# Patient Record
Sex: Male | Born: 1955
Health system: Southern US, Community
[De-identification: ages and names within clinical notes are randomized; demographics above are authoritative.]

## PROBLEM LIST (undated history)

## (undated) DIAGNOSIS — K635 Polyp of colon: Secondary | ICD-10-CM

## (undated) DIAGNOSIS — I82409 Acute embolism and thrombosis of unspecified deep veins of unspecified lower extremity: Secondary | ICD-10-CM

## (undated) DIAGNOSIS — S42035A Nondisplaced fracture of lateral end of left clavicle, initial encounter for closed fracture: Secondary | ICD-10-CM

## (undated) DIAGNOSIS — H53009 Unspecified amblyopia, unspecified eye: Secondary | ICD-10-CM

## (undated) DIAGNOSIS — S52022A Displaced fracture of olecranon process without intraarticular extension of left ulna, initial encounter for closed fracture: Secondary | ICD-10-CM

## (undated) DIAGNOSIS — I10 Essential (primary) hypertension: Secondary | ICD-10-CM

## (undated) HISTORY — DX: Unspecified amblyopia, unspecified eye: H53.009

## (undated) HISTORY — DX: Essential (primary) hypertension: I10

## (undated) HISTORY — PX: OTHER SURGICAL HISTORY: SHX169

## (undated) HISTORY — DX: Acute embolism and thrombosis of unspecified deep veins of unspecified lower extremity: I82.409

---

## 1988-05-03 HISTORY — PX: WRIST FRACTURE SURGERY: SHX121

## 2001-11-13 ENCOUNTER — Encounter (INDEPENDENT_AMBULATORY_CARE_PROVIDER_SITE_OTHER): Payer: Self-pay | Admitting: *Deleted

## 2001-11-13 ENCOUNTER — Ambulatory Visit (HOSPITAL_COMMUNITY): Admission: RE | Admit: 2001-11-13 | Discharge: 2001-11-13 | Payer: Self-pay | Admitting: Gastroenterology

## 2002-02-10 ENCOUNTER — Encounter: Payer: Self-pay | Admitting: Neurosurgery

## 2002-02-10 ENCOUNTER — Ambulatory Visit (HOSPITAL_COMMUNITY): Admission: RE | Admit: 2002-02-10 | Discharge: 2002-02-10 | Payer: Self-pay | Admitting: Neurosurgery

## 2002-02-14 ENCOUNTER — Encounter: Payer: Self-pay | Admitting: Neurosurgery

## 2002-02-16 ENCOUNTER — Encounter: Payer: Self-pay | Admitting: Anesthesiology

## 2002-02-16 ENCOUNTER — Observation Stay (HOSPITAL_COMMUNITY): Admission: RE | Admit: 2002-02-16 | Discharge: 2002-02-17 | Payer: Self-pay | Admitting: Neurosurgery

## 2007-09-19 ENCOUNTER — Ambulatory Visit (HOSPITAL_COMMUNITY): Admission: RE | Admit: 2007-09-19 | Discharge: 2007-09-19 | Payer: Self-pay | Admitting: Neurosurgery

## 2010-09-15 NOTE — Op Note (Signed)
NAMEARTEMIS, Kyle Reese               ACCOUNT NO.:  [id number]   MEDICAL RECORD NO.:  [id number]          PATIENT TYPE:  OIB   LOCATION:  3533                         FACILITY:  [facility]   PHYSICIAN:  Danae Orleans. Venetia Maxon, M.D.  DATE OF BIRTH:  1955-07-27   DATE OF PROCEDURE:  09/19/2007  DATE OF DISCHARGE:  09/19/2007                               OPERATIVE REPORT   PREOPERATIVE DIAGNOSES:  Herniated cervical disk C6-C7 with cervical  spondylosis at C5-C6 and C6-C7 levels with degenerative disk disease and  cervical radiculopathy.   POSTOPERATIVE DIAGNOSES:  Herniated cervical disk C6-C7 with cervical  spondylosis at C5-C6 and C6-C7 levels with degenerative disk disease and  cervical radiculopathy.   PROCEDURE:  1. Anterior cervical decompression with anterior cervical diskectomy      using microdissection at C6-C7.  2. Synthes ProDisc total disk arthroplasty at C6-C7.   SURGEON:  Danae Orleans. Venetia Maxon, M.D.   ASSISTANT:  Georgiann Cocker, RN and Cristi Loron, M.D.   ANESTHESIA:  General endotracheal anesthesia.   ESTIMATED BLOOD LOSS:  100 mL.   COMPLICATIONS:  None.   DISPOSITION:  Recovery.   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  He has a significant cervical spondylosis at C5-  C6, but does not appear to be symptomatic from this level.  I  recommended that he either undergo anterior cervical decompression and  arthroplasty at C6-C7 or anterior cervical decompression and fusion at  C5 through C7 levels.  He elected to pursue disk arthroplasty.  Because  of the severity of his pain and unrelenting nature of his symptoms, it  was elected to schedule the surgery.  We initiated multiple appeals  through [employer], his insurance company for authorization  for this procedure, but  both he and I felt that this was the medically  appropriate surgery and could not wait for exhaustive appeals process;  therefore, elected to proceed with surgical intervention in a timely  fashion.   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  Following  satisfactory and uncomplicated induction of general endotracheal  anesthesia and placement of intravenous lines,  the patient was placed  in a supine position on the operating table.  His neck was placed in  slight extension.  He was placed on doughnut head holder.  His sheet  roll was placed behind his shoulders and a rolled towel was placed  underlying his neck.  His shoulders were take down for visualization and  the C-arm was brought in, and we were able to visualize the C6-C7 level.  The patient's neck was then prepped and draped in the usual sterile  fashion and area of plain incision was infiltrated with local lidocaine  with epinephrine.  An incision was made from the midline to the anterior  border sternocleidomastoid muscle, carried sharply through platysma  layer.  Subplatysmal dissection was performed exposing the anterior  border of sternocleidomastoid muscle.  Using blunt dissection, the  carotid sheath was kept lateral, trachea, and esophagus kept medial,  exposing the anterior cervical spine.  The bent spinal needle was placed  and it was felt to be the C6-C7 level and this was confirmed on  intraoperative x-ray.  Subsequently, the longus colli muscles were taken  down from the anterior cervical spine from C6 through C7 bilaterally  using electrocautery and Key elevator and then self-retaining shadow  line retractor was placed to facilitate the exposure.  Using the Synthes  ProDisc distraction pins, these were placed in the C6 vertebra as well  as in the C7 vertebra in the midline, 16 mm pins were placed after using  all.  Using gentle distraction after incising the interspace and  removing disk material,  the endplates were distracted slightly, the  microscope was brought into the field, minimal drilling was performed,  but uncinate spurs were drilled down, particularly on the left at the C6-  C7 level.  The posterior longitudinal ligament was removed in piecemeal  fashion and the interspace was evacuated of residual disk material.  Under microdissection technique, the posterior longitudinal ligament was  removed and the C7 nerve roots were decompressed widely as they extended  out the neural foramina.  On the left side of midline, as anticipated,  multiple fragments of herniated disk material were removed, which were  directly compressing the left C7 nerve root.  This was felt to be well  decompressed at this point.  Hemostasis was assured and then the  distraction was removed.  The microscope was taken down this field and  total disk arthroplasty was performed.  This was done utilizing the  Synthes ProDisc system and a large deep 5 mm implant was placed after  trial sizing and confirming its position on AP and lateral fluoroscopy.  Keel cuts were made in the usual fashion with milling guide and the  implant was well visualized within the interspace.  The implant was then  inserted and tamped to position appropriately and the keel cuts were  waxed.  The hemostasis was assured and final radiograph demonstrated  well-positioned arthroplasty both on lateral and AP projections.  The  wound was then again irrigated and the distraction pins were removed and  their sites of entry into the bone were waxed.  The soft tissues were  inspected and found to be in good repair.  Hemostasis was again assured.  Platysma layer was closed with 3-0 Vicryl sutures.  Skin edges were  approximated with 3-0 Vicryl interrupted inverted sutures and the wound  was dressed with Dermabond.  The patient was x-rayed in the operating  room and taken to recovery in stable and satisfactory condition having  tolerated this  operation well.  Counts were correct.      Danae Orleans. Venetia Maxon, M.D.  Electronically Signed     JDS/MEDQ  D:  09/19/2007  T:  09/20/2007  Job:  [id number]

## 2010-09-18 NOTE — Op Note (Signed)
NAMEELGIE, MAZIARZ                         ACCOUNT NO.:  [id number]   MEDICAL RECORD NO.:  [id number]                   PATIENT TYPE:  INP   LOCATION:  3011                                 FACILITY:  [facility]   PHYSICIAN:  Danae Orleans. Venetia Maxon, M.D.               DATE OF BIRTH:  1956-01-27   DATE OF PROCEDURE:  02/16/2002  DATE OF DISCHARGE:  02/17/2002                                 OPERATIVE REPORT   PREOPERATIVE DIAGNOSIS:  Herniated cervical disk, C6-7 right, with cervical  radiculopathy, spondylosis, and degenerative disk disease.   POSTOPERATIVE DIAGNOSIS:  Herniated cervical disk, C6-7 right, with cervical  radiculopathy, spondylosis, and degenerative disk disease.   PROCEDURE:  Right C6-7 foraminotomy and microdiskectomy with  microdissection.   SURGEON:  Danae Orleans. Venetia Maxon, M.D.   ASSISTANT:  Coletta Memos, M.D.   ANESTHESIA:  General endotracheal anesthesia.   ESTIMATED BLOOD LOSS:  50 cc.   COMPLICATIONS:  None.   DISPOSITION:  To recovery.   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  It was  elected to take him to surgery for posterior foraminotomy and  microdiskectomy, as he has significant spondylosis and foraminal stenosis at  C5-6 as well and did not wish to undergo two-level anterior cervical  diskectomy and fusion.   DESCRIPTION OF PROCEDURE:  The patient was brought to the operating room.  Following the satisfactory and uncomplicated induction of general  endotracheal anesthesia, a central catheter was placed by anesthesia.  Then  he was placed in three-pin head fixation.  He was placed in a sitting  position with his neck maintained in neutral with a slightly flexed  alignment.  His posterior neck was then prepped and draped in the usual  sterile fashion.  Precordial monitoring was performed throughout the case.  Using the Met-RX tubular retractor system  and fluoroscopic visualization,  initially a guidewire was placed and then subsequently sequential dilators  to a 16 mm cannula.  The 16 mm tubular retractor to a depth of 6 cm was then  placed with fluoroscopic confirmation of its positioning.  The C6 and C7  laminae and facets were identified and using the Anspach drill and A2  equivalent bur, hemisemilaminotomy of C6 and C7 were then performed with  medial facetectomy.  This was then completed with the Kerrison rongeurs and  the common dural tube and C7 nerve root were then identified.  Using  microdissection technique and micro-instruments, the C7 nerve root was very  carefully mobilized and in the medial position directly beneath the nerve,  which initially was significantly tethered by the herniated disk material, a  large fragment of disk material was removed.  It was quite difficult to  mobilize the nerve root because of the large fragment.  Subsequently,  however, the nerve root appeared to be pulsatile  and relaxed.  The wound was  copiously irrigated with bacitracin and saline and hemostasis was assured  with Gelfoam soaked in thrombin.  The posterior cervical fascia was then  closed with 2-  0 Vicryl sutures and the skin edges were reapproximated with interrupted 3-0  Vicryl subcuticular stitch.  The wound was dressed with Dermabond.  The  patient was taken out of pin fixation and extubated in the operating room  and taken to recovery in stable and satisfactory condition.                                               Danae Orleans. Venetia Maxon, M.D.    JDS/MEDQ  D:  02/16/2002  T:  02/18/2002  Job:  [id number]

## 2011-01-27 LAB — CBC
HCT: 43.1
Platelets: 173
RDW: 13.2
WBC: 6.7

## 2011-04-15 ENCOUNTER — Other Ambulatory Visit (HOSPITAL_COMMUNITY): Payer: Self-pay | Admitting: Gastroenterology

## 2011-04-20 ENCOUNTER — Ambulatory Visit (HOSPITAL_COMMUNITY)
Admission: RE | Admit: 2011-04-20 | Discharge: 2011-04-20 | Disposition: A | Discharge status: Home / Self Care | Payer: BC Managed Care – PPO | Source: Ambulatory Visit | Attending: Gastroenterology | Admitting: Gastroenterology

## 2011-04-20 ENCOUNTER — Encounter (HOSPITAL_COMMUNITY): Payer: Self-pay

## 2011-04-20 DIAGNOSIS — R1310 Dysphagia, unspecified: Secondary | ICD-10-CM | POA: Insufficient documentation

## 2011-04-22 ENCOUNTER — Encounter (HOSPITAL_COMMUNITY)
Admission: RE | Disposition: A | Discharge status: Home / Self Care | Payer: Self-pay | Source: Ambulatory Visit | Attending: Gastroenterology

## 2011-04-22 ENCOUNTER — Other Ambulatory Visit: Payer: Self-pay | Admitting: Gastroenterology

## 2011-04-22 ENCOUNTER — Ambulatory Visit (HOSPITAL_COMMUNITY)
Admission: RE | Admit: 2011-04-22 | Discharge: 2011-04-22 | Disposition: A | Discharge status: Home / Self Care | Payer: BC Managed Care – PPO | Source: Ambulatory Visit | Attending: Gastroenterology | Admitting: Gastroenterology

## 2011-04-22 DIAGNOSIS — E785 Hyperlipidemia, unspecified: Secondary | ICD-10-CM | POA: Insufficient documentation

## 2011-04-22 DIAGNOSIS — R0789 Other chest pain: Secondary | ICD-10-CM | POA: Insufficient documentation

## 2011-04-22 DIAGNOSIS — R1310 Dysphagia, unspecified: Secondary | ICD-10-CM | POA: Insufficient documentation

## 2011-04-22 DIAGNOSIS — Z7982 Long term (current) use of aspirin: Secondary | ICD-10-CM | POA: Insufficient documentation

## 2011-04-22 HISTORY — PX: ESOPHAGOGASTRODUODENOSCOPY: SHX5428

## 2011-04-22 HISTORY — DX: Polyp of colon: K63.5

## 2011-04-22 SURGERY — EGD (ESOPHAGOGASTRODUODENOSCOPY)
Anesthesia: Moderate Sedation

## 2011-04-22 MED ORDER — FENTANYL CITRATE 0.05 MG/ML IJ SOLN
INTRAMUSCULAR | Status: AC
  Filled 2011-04-22: qty 2

## 2011-04-22 MED ORDER — FENTANYL NICU IV SYRINGE 50 MCG/ML
INJECTION | INTRAMUSCULAR | Status: DC | PRN
  Administered 2011-04-22: 50 ug via INTRAVENOUS
  Administered 2011-04-22 (×3): 25 ug via INTRAVENOUS

## 2011-04-22 MED ORDER — MIDAZOLAM HCL 10 MG/2ML IJ SOLN
INTRAMUSCULAR | Status: DC | PRN
  Administered 2011-04-22 (×4): 2.5 mg via INTRAVENOUS

## 2011-04-22 MED ORDER — MIDAZOLAM HCL 10 MG/2ML IJ SOLN
INTRAMUSCULAR | Status: AC
  Filled 2011-04-22: qty 2

## 2011-04-22 MED ORDER — SODIUM CHLORIDE 0.9 % IV SOLN
Freq: Once | INTRAVENOUS | Status: AC
  Administered 2011-04-22: 20 mL/h via INTRAVENOUS

## 2011-04-22 MED ORDER — BUTAMBEN-TETRACAINE-BENZOCAINE 2-2-14 % EX AERO
INHALATION_SPRAY | CUTANEOUS | Status: DC | PRN
  Administered 2011-04-22: 1 via TOPICAL

## 2011-04-22 NOTE — Op Note (Signed)
Procedure: Diagnostic esophagogastroduodenoscopy with esophageal biopsies to rule out Barrett's esophagus and eosinophilic esophagitis.  Endoscopist: Danise Edge  Premedication: Fentanyl 100 mcg intravenously. Versed 10 mg intravenously.  Indication: Kyle Reese is a 55 year old male born December 19, 1955. The patient has had intermittent solid food  Esophageal dysphagia and intermittent mid retrosternal chest discomfort on and off since July 2012. His father was diagnosed with Barrett's esophagus. He denies chronic heartburn or indigestion. He underwent a barium esophagram with barium tablet which showed subtle mucosal thickening in the distal esophagus suggestive of esophagitis; the barium tablet traversed the esophagus and into the stomach without obstruction.  The patient is scheduled undergo diagnostic esophagogastroduodenoscopy with esophageal biopsies to rule out Barrett's esophagus and eosinophilic esophagitis.  Procedure: The patient was placed in the left lateral decubitus position. The Pentax gastroscope was passed through the posterior hypopharynx into the proximal esophagus without difficulty. The hypopharynx, larynx, and vocal cords appeared normal.  Esophagoscopy: The proximal, mid, and lower segments of the esophageal mucosa appeared normal. The squamocolumnar junction is noted at approximately 39 cm from the incisor teeth. There is no endoscopic evidence for the presence of erosive esophagitis, esophageal stricture formation, esophageal obstruction, or Barrett's esophagus. Four-quadrant biopsies were taken at the esophagogastric junction to look for Barrett's esophagus. Random esophageal body biopsies were performed to look for eosinophilic esophagitis.  Gastroscopy: Retroflex view of the gastric cardia and fundus was normal. The gastric body, antrum, and pylorus appeared completely normal.  Duodenoscopy: The duodenal bulb and descending duodenum appeared normal  Assessment:  Normal esophagogastroduodenoscopy. Esophageal biopsies to look for Barrett's esophagus and eosinophilic esophagitis are pending.

## 2011-04-22 NOTE — H&P (Signed)
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 The patient has had intermittent solid food esophageal dysphagia and intermittent mid retrosternal chest discomfort on and off since July 2012. He tried taking omeprazole without significant improvement in his symptoms. His father was diagnosed with Barrett's esophagus. He denies chronic heartburn or indigestion.  The patient underwent a diagnostic barium esophagram with barium tablet which showed subtle mucosal fold thickening in the distal esophagus suspicious for a esophagitis. The barium tablet traversed the esophagus and into the stomach without obstruction.  Chronic medications: Coenzyme Q 10. Vitamin D. Is a. Fish oil. Magnesium. Aspirin.  Past medical and surgical history: Hyperlipidemia. Internal hemorrhoids. Adenomatous colon polyps removed colonoscopically. Cervical disc surgery. Left wrist fracture with surgical repair.  Habits: The patient has never smoked cigarettes. He consumes alcohol in moderation.  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 Sclera and conjunctiva appear normal. Mouth and throat appear normal. Lungs are clear to auscultation. Cardiac exam reveals a regular rhythm without audible murmurs. Abdomen is soft, flat, and nontender to palpation in all quadrants  Plan: Proceed with diagnostic esophagogastroduodenoscopy with random esophageal biopsies to rule out eosinophilic esophagitis and to rule out Barrett's esophagus.

## 2011-04-23 ENCOUNTER — Encounter (HOSPITAL_COMMUNITY): Payer: Self-pay | Admitting: Gastroenterology

## 2011-04-23 ENCOUNTER — Encounter (HOSPITAL_COMMUNITY): Payer: Self-pay

## 2011-11-18 ENCOUNTER — Ambulatory Visit (INDEPENDENT_AMBULATORY_CARE_PROVIDER_SITE_OTHER): Payer: BC Managed Care – PPO | Admitting: Sports Medicine

## 2011-11-18 ENCOUNTER — Ambulatory Visit
Admission: RE | Admit: 2011-11-18 | Discharge: 2011-11-18 | Disposition: A | Discharge status: Home / Self Care | Payer: BC Managed Care – PPO | Source: Ambulatory Visit | Attending: Sports Medicine | Admitting: Sports Medicine

## 2011-11-18 ENCOUNTER — Other Ambulatory Visit: Payer: Self-pay | Admitting: Sports Medicine

## 2011-11-18 VITALS — BP 147/88 | Ht 68.0 in | Wt 170.0 lb

## 2011-11-18 DIAGNOSIS — R0781 Pleurodynia: Secondary | ICD-10-CM

## 2011-11-18 DIAGNOSIS — R079 Chest pain, unspecified: Secondary | ICD-10-CM

## 2011-11-18 DIAGNOSIS — S2239XA Fracture of one rib, unspecified side, initial encounter for closed fracture: Secondary | ICD-10-CM

## 2011-11-18 MED ORDER — FLUCONAZOLE 200 MG PO TABS: ORAL_TABLET | ORAL | Status: DC
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
## 2011-11-18 NOTE — Patient Instructions (Addendum)
Wear rib belt for exercising and for comfort as tolerated  Start calcium and vitamin D supplements  Follow up in 6 weeks if you are not improving  Thank you for seeing [place] today!

## 2011-11-18 NOTE — Assessment & Plan Note (Signed)
Use a rib belt for comfort  Return to work out to limit of pain  Expect this to heal over  3-4 additional weeks

## 2013-04-12 ENCOUNTER — Encounter: Payer: Self-pay | Admitting: Sports Medicine

## 2013-04-12 ENCOUNTER — Ambulatory Visit (INDEPENDENT_AMBULATORY_CARE_PROVIDER_SITE_OTHER): Payer: BC Managed Care – PPO | Admitting: Sports Medicine

## 2013-04-12 VITALS — BP 129/83 | Ht 68.0 in | Wt 170.0 lb

## 2013-04-12 DIAGNOSIS — M19019 Primary osteoarthritis, unspecified shoulder: Secondary | ICD-10-CM

## 2013-04-12 DIAGNOSIS — M25519 Pain in unspecified shoulder: Secondary | ICD-10-CM

## 2013-04-12 DIAGNOSIS — M25511 Pain in right shoulder: Secondary | ICD-10-CM

## 2013-04-12 DIAGNOSIS — M19011 Primary osteoarthritis, right shoulder: Secondary | ICD-10-CM

## 2013-04-12 DIAGNOSIS — M722 Plantar fascial fibromatosis: Secondary | ICD-10-CM

## 2013-04-12 NOTE — Assessment & Plan Note (Signed)
Given an exercise series to try to stabilize subscap  Avoid ER against resistance at any painful positions  If no change with home EX prog we can consider other options

## 2013-04-12 NOTE — Assessment & Plan Note (Signed)
OK to use NSAIDs as needed  Note CSI did not help much and will not repeat today

## 2013-04-12 NOTE — Progress Notes (Signed)
Patient ID: Kyle Reese, male   DOB: 09/15/1955, 57 y.o.   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 Palpation is normal with no tenderness over AC joint or bicipital groove. ROM is full in all planes. Rotator cuff strength normal throughout. No signs of impingement with negative Neer and Hawkin's tests, empty can. Speeds and Yergason's tests normal. No labral pathology noted with negative Obrien's, negative clunk and good stability. Normal scapular function observed. No painful arc and no drop arm sign. No apprehension sign  I can only reproduce pain by ER with resistance in abducted elevated position  Note he gets some pain with ER resistance to IR or with forced ER while arm abducted  Foot Mild hallux limitus on RT MTP 1 TTP at medial calcaneus  Some loss of long arch and some splaying of medial column RT > LT  MSK [place] Spur of 0.37cms noted on humeral head at attachment of subscap No tear in subscap tendon Mod AC joint DJD with effusion on RT but norm on LT BT, Suprs spin, Inf spin, TM tendons all wnl

## 2013-04-12 NOTE — Assessment & Plan Note (Signed)
HEP  Stretches  Arch support  Icing  Reck if no response

## 2013-12-10 ENCOUNTER — Ambulatory Visit (INDEPENDENT_AMBULATORY_CARE_PROVIDER_SITE_OTHER): Payer: BC Managed Care – PPO | Admitting: Podiatry

## 2013-12-10 DIAGNOSIS — B351 Tinea unguium: Secondary | ICD-10-CM

## 2013-12-10 NOTE — Progress Notes (Signed)
   Subjective:    Patient ID: Kyle Reese, male    DOB: 1955/05/17, 58 y.o.   MRN: [id number]  HPI  PT STATED INJURED B/L GREAT TOENAIL HAVE DISCOLORATION FOR 5 WEEKS BUT DOES NOT HURT. TRIED NOP TREATMENT.    Review of Systems  All other systems reviewed and are negative.      Objective:   Physical Exam        Assessment & Plan:

## 2013-12-10 NOTE — Progress Notes (Signed)
Subjective:     Patient ID: Kyle Reese, male   DOB: [birth year], 58 y.o.   MRN: [id number]  HPI patient presents stating my nails have discolored and I just finished hiking for 6 days   Review of Systems     Objective:   Physical Exam Neurovascular status unchanged with discolored hallux nails bilateral    Assessment:     Damage to the hallux nail secondary to trauma from hiking    Plan:     Allow nail to grow out no treatment currently

## 2014-11-23 ENCOUNTER — Emergency Department (HOSPITAL_COMMUNITY): Payer: BLUE CROSS/BLUE SHIELD

## 2014-11-23 ENCOUNTER — Inpatient Hospital Stay (HOSPITAL_COMMUNITY)
Admission: EM | Admit: 2014-11-23 | Discharge: 2014-11-26 | DRG: 511 | Disposition: A | Discharge status: Home / Self Care | Payer: BLUE CROSS/BLUE SHIELD | Attending: Surgery | Admitting: Surgery

## 2014-11-23 ENCOUNTER — Inpatient Hospital Stay (HOSPITAL_COMMUNITY): Payer: BLUE CROSS/BLUE SHIELD

## 2014-11-23 ENCOUNTER — Encounter (HOSPITAL_COMMUNITY): Payer: Self-pay | Admitting: Emergency Medicine

## 2014-11-23 DIAGNOSIS — S2249XA Multiple fractures of ribs, unspecified side, initial encounter for closed fracture: Secondary | ICD-10-CM

## 2014-11-23 DIAGNOSIS — S52002A Unspecified fracture of upper end of left ulna, initial encounter for closed fracture: Secondary | ICD-10-CM

## 2014-11-23 DIAGNOSIS — T07XXXA Unspecified multiple injuries, initial encounter: Secondary | ICD-10-CM | POA: Diagnosis present

## 2014-11-23 DIAGNOSIS — C4491 Basal cell carcinoma of skin, unspecified: Secondary | ICD-10-CM | POA: Diagnosis present

## 2014-11-23 DIAGNOSIS — Z7982 Long term (current) use of aspirin: Secondary | ICD-10-CM

## 2014-11-23 DIAGNOSIS — M25522 Pain in left elbow: Secondary | ICD-10-CM | POA: Diagnosis present

## 2014-11-23 DIAGNOSIS — D62 Acute posthemorrhagic anemia: Secondary | ICD-10-CM | POA: Diagnosis not present

## 2014-11-23 DIAGNOSIS — S2242XA Multiple fractures of ribs, left side, initial encounter for closed fracture: Secondary | ICD-10-CM | POA: Diagnosis present

## 2014-11-23 DIAGNOSIS — S52022A Displaced fracture of olecranon process without intraarticular extension of left ulna, initial encounter for closed fracture: Secondary | ICD-10-CM | POA: Diagnosis present

## 2014-11-23 DIAGNOSIS — S2231XA Fracture of one rib, right side, initial encounter for closed fracture: Secondary | ICD-10-CM

## 2014-11-23 DIAGNOSIS — R0781 Pleurodynia: Secondary | ICD-10-CM | POA: Diagnosis present

## 2014-11-23 DIAGNOSIS — Z9889 Other specified postprocedural states: Secondary | ICD-10-CM

## 2014-11-23 DIAGNOSIS — Z8781 Personal history of (healed) traumatic fracture: Secondary | ICD-10-CM

## 2014-11-23 DIAGNOSIS — S42002A Fracture of unspecified part of left clavicle, initial encounter for closed fracture: Secondary | ICD-10-CM

## 2014-11-23 DIAGNOSIS — S42402A Unspecified fracture of lower end of left humerus, initial encounter for closed fracture: Secondary | ICD-10-CM | POA: Diagnosis present

## 2014-11-23 DIAGNOSIS — S42032A Displaced fracture of lateral end of left clavicle, initial encounter for closed fracture: Secondary | ICD-10-CM | POA: Diagnosis present

## 2014-11-23 DIAGNOSIS — S4992XA Unspecified injury of left shoulder and upper arm, initial encounter: Secondary | ICD-10-CM

## 2014-11-23 DIAGNOSIS — Y9355 Activity, bike riding: Secondary | ICD-10-CM

## 2014-11-23 DIAGNOSIS — S52032A Displaced fracture of olecranon process with intraarticular extension of left ulna, initial encounter for closed fracture: Principal | ICD-10-CM | POA: Diagnosis present

## 2014-11-23 DIAGNOSIS — S42035A Nondisplaced fracture of lateral end of left clavicle, initial encounter for closed fracture: Secondary | ICD-10-CM

## 2014-11-23 HISTORY — DX: Displaced fracture of olecranon process without intraarticular extension of left ulna, initial encounter for closed fracture: S52.022A

## 2014-11-23 HISTORY — DX: Nondisplaced fracture of lateral end of left clavicle, initial encounter for closed fracture: S42.035A

## 2014-11-23 LAB — CBC WITH DIFFERENTIAL/PLATELET
BASOS PCT: 0 % (ref 0–1)
Basophils Absolute: 0 10*3/uL (ref 0.0–0.1)
EOS ABS: 0 10*3/uL (ref 0.0–0.7)
EOS PCT: 0 % (ref 0–5)
HCT: 44.5 % (ref 39.0–52.0)
Hemoglobin: 15.6 g/dL (ref 13.0–17.0)
LYMPHS ABS: 0.6 10*3/uL — AB (ref 0.7–4.0)
LYMPHS PCT: 7 % — AB (ref 12–46)
MCH: 32.8 pg (ref 26.0–34.0)
MCHC: 35.1 g/dL (ref 30.0–36.0)
MCV: 93.5 fL (ref 78.0–100.0)
MONO ABS: 0.6 10*3/uL (ref 0.1–1.0)
MONOS PCT: 6 % (ref 3–12)
NEUTROS ABS: 8.3 10*3/uL — AB (ref 1.7–7.7)
NEUTROS PCT: 87 % — AB (ref 43–77)
PLATELETS: 199 10*3/uL (ref 150–400)
RBC: 4.76 MIL/uL (ref 4.22–5.81)
RDW: 13.3 % (ref 11.5–15.5)
WBC: 9.6 10*3/uL (ref 4.0–10.5)

## 2014-11-23 LAB — COMPREHENSIVE METABOLIC PANEL
ALT: 32 U/L (ref 17–63)
AST: 33 U/L (ref 15–41)
Albumin: 4.4 g/dL (ref 3.5–5.0)
Alkaline Phosphatase: 47 U/L (ref 38–126)
Anion gap: 12 (ref 5–15)
BILIRUBIN TOTAL: 1 mg/dL (ref 0.3–1.2)
BUN: 22 mg/dL — AB (ref 6–20)
CALCIUM: 9.8 mg/dL (ref 8.9–10.3)
CO2: 23 mmol/L (ref 22–32)
CREATININE: 1.12 mg/dL (ref 0.61–1.24)
Chloride: 101 mmol/L (ref 101–111)
Glucose, Bld: 114 mg/dL — ABNORMAL HIGH (ref 65–99)
POTASSIUM: 5.1 mmol/L (ref 3.5–5.1)
SODIUM: 136 mmol/L (ref 135–145)
Total Protein: 7.6 g/dL (ref 6.5–8.1)

## 2014-11-23 MED ORDER — HYDROMORPHONE 0.3 MG/ML IV SOLN
INTRAVENOUS | Status: DC
  Administered 2014-11-23: 17:00:00 via INTRAVENOUS
  Administered 2014-11-24: 1 mg via INTRAVENOUS
  Filled 2014-11-23 (×2): qty 25

## 2014-11-23 MED ORDER — OXYCODONE HCL 5 MG PO TABS
2.5000 mg | ORAL_TABLET | ORAL | Status: DC | PRN
Start: 2014-11-23 — End: 2014-11-26
  Administered 2014-11-25: 2.5 mg via ORAL
  Filled 2014-11-23: qty 1

## 2014-11-23 MED ORDER — MORPHINE SULFATE 4 MG/ML IJ SOLN
4.0000 mg | Freq: Once | INTRAMUSCULAR | Status: AC
  Administered 2014-11-23: 4 mg via INTRAVENOUS
  Filled 2014-11-23: qty 1

## 2014-11-23 MED ORDER — ONDANSETRON HCL 4 MG PO TABS: 4.0000 mg | ORAL_TABLET | Freq: Four times a day (QID) | ORAL | Status: DC | PRN

## 2014-11-23 MED ORDER — DIAZEPAM 5 MG/ML IJ SOLN
5.0000 mg | Freq: Once | INTRAMUSCULAR | Status: AC
  Administered 2014-11-23: 5 mg via INTRAVENOUS
  Filled 2014-11-23: qty 2

## 2014-11-23 MED ORDER — MORPHINE SULFATE 2 MG/ML IJ SOLN
INTRAMUSCULAR | Status: AC
Start: 2014-11-23 — End: 2014-11-23
  Filled 2014-11-23: qty 2

## 2014-11-23 MED ORDER — NALOXONE HCL 0.4 MG/ML IJ SOLN: 0.4000 mg | INTRAMUSCULAR | Status: DC | PRN

## 2014-11-23 MED ORDER — OXYCODONE HCL 5 MG PO TABS
10.0000 mg | ORAL_TABLET | ORAL | Status: DC | PRN
  Administered 2014-11-25 (×2): 10 mg via ORAL
  Administered 2014-11-25: 5 mg via ORAL
  Administered 2014-11-25 – 2014-11-26 (×3): 10 mg via ORAL
  Filled 2014-11-23 (×5): qty 2

## 2014-11-23 MED ORDER — OXYCODONE HCL 5 MG PO TABS
5.0000 mg | ORAL_TABLET | ORAL | Status: DC | PRN
  Administered 2014-11-24 – 2014-11-25 (×3): 5 mg via ORAL
  Filled 2014-11-23 (×6): qty 1

## 2014-11-23 MED ORDER — ONDANSETRON HCL 4 MG/2ML IJ SOLN: 4.0000 mg | Freq: Four times a day (QID) | INTRAMUSCULAR | Status: DC | PRN

## 2014-11-23 MED ORDER — DIPHENHYDRAMINE HCL 12.5 MG/5ML PO ELIX: 12.5000 mg | ORAL_SOLUTION | Freq: Four times a day (QID) | ORAL | Status: DC | PRN

## 2014-11-23 MED ORDER — SODIUM CHLORIDE 0.9 % IJ SOLN: 9.0000 mL | INTRAMUSCULAR | Status: DC | PRN

## 2014-11-23 MED ORDER — MORPHINE SULFATE 4 MG/ML IJ SOLN
4.0000 mg | Freq: Once | INTRAMUSCULAR | Status: AC
  Administered 2014-11-23: 4 mg via INTRAVENOUS

## 2014-11-23 MED ORDER — ONDANSETRON HCL 4 MG/2ML IJ SOLN
4.0000 mg | Freq: Once | INTRAMUSCULAR | Status: AC
  Administered 2014-11-23: 4 mg via INTRAVENOUS
  Filled 2014-11-23: qty 2

## 2014-11-23 MED ORDER — SODIUM CHLORIDE 0.9 % IV BOLUS (SEPSIS)
1000.0000 mL | Freq: Once | INTRAVENOUS | Status: AC
  Administered 2014-11-23: 1000 mL via INTRAVENOUS

## 2014-11-23 MED ORDER — CEFAZOLIN SODIUM-DEXTROSE 2-3 GM-% IV SOLR
2.0000 g | INTRAVENOUS | Status: AC
  Administered 2014-11-24: 2 g via INTRAVENOUS
  Filled 2014-11-23: qty 50

## 2014-11-23 MED ORDER — POTASSIUM CHLORIDE IN NACL 20-0.9 MEQ/L-% IV SOLN
INTRAVENOUS | Status: DC
  Administered 2014-11-23 – 2014-11-24 (×2): via INTRAVENOUS
  Administered 2014-11-25: 1 mL via INTRAVENOUS
  Filled 2014-11-23 (×3): qty 1000

## 2014-11-23 MED ORDER — ENOXAPARIN SODIUM 40 MG/0.4ML ~~LOC~~ SOLN: 40.0000 mg | SUBCUTANEOUS | Status: DC

## 2014-11-23 MED ORDER — DIPHENHYDRAMINE HCL 50 MG/ML IJ SOLN: 12.5000 mg | Freq: Four times a day (QID) | INTRAMUSCULAR | Status: DC | PRN

## 2014-11-23 MED ORDER — MORPHINE SULFATE 2 MG/ML IJ SOLN: 1.0000 mg | INTRAMUSCULAR | Status: DC | PRN
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
## 2014-11-23 NOTE — Progress Notes (Signed)
Utilization Review completed. Paradise Vensel RN BSN CM 

## 2014-11-23 NOTE — ED Notes (Signed)
[employer] at bedside.
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
## 2014-11-23 NOTE — ED Notes (Signed)
Patient transported to X-ray 
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
## 2014-11-23 NOTE — Progress Notes (Signed)
Pt unable to urinate ,did the bladder scan 686 ml, paged on call MD and ordered foley cath as per pt preference instead of in and out cath and urine output 600 ml and relieved from discomfort.

## 2014-11-23 NOTE — Progress Notes (Signed)
Orthopedic Tech Progress Note Patient Details:  Kyle Reese April 18, 1956 [id number]  Ortho Devices Type of Ortho Device: Ace wrap, Long arm splint Ortho Device/Splint Location: lue Ortho Device/Splint Interventions: Application   Kyle Reese 11/23/2014, 2:29 PM
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
## 2014-11-23 NOTE — Progress Notes (Signed)
Orthopedic Tech Progress Note Patient Details:  CORRION STIREWALT 10-29-1955 [id number]  Ortho Devices Type of Ortho Device: Ace wrap, Long arm splint Ortho Device/Splint Location: applied overehead frame Ortho Device/Splint Interventions: Ordered, Application   Braulio Bosch 11/23/2014, 8:18 PM

## 2014-11-24 ENCOUNTER — Inpatient Hospital Stay (HOSPITAL_COMMUNITY): Payer: BLUE CROSS/BLUE SHIELD | Admitting: Anesthesiology

## 2014-11-24 ENCOUNTER — Inpatient Hospital Stay (HOSPITAL_COMMUNITY): Payer: BLUE CROSS/BLUE SHIELD

## 2014-11-24 ENCOUNTER — Encounter (HOSPITAL_COMMUNITY): Admission: EM | Disposition: A | Discharge status: Home / Self Care | Payer: Self-pay | Source: Home / Self Care

## 2014-11-24 HISTORY — PX: ORIF ULNAR FRACTURE: SHX5417

## 2014-11-24 LAB — SURGICAL PCR SCREEN
MRSA, PCR: NEGATIVE
Staphylococcus aureus: NEGATIVE

## 2014-11-24 SURGERY — OPEN REDUCTION INTERNAL FIXATION (ORIF) ULNAR FRACTURE
Anesthesia: General | Site: Elbow | Laterality: Left

## 2014-11-24 MED ORDER — EPHEDRINE SULFATE 50 MG/ML IJ SOLN
INTRAMUSCULAR | Status: AC
  Filled 2014-11-24: qty 1

## 2014-11-24 MED ORDER — EPHEDRINE SULFATE 50 MG/ML IJ SOLN
INTRAMUSCULAR | Status: DC | PRN
  Administered 2014-11-24 (×3): 10 mg via INTRAVENOUS

## 2014-11-24 MED ORDER — FENTANYL CITRATE (PF) 250 MCG/5ML IJ SOLN
INTRAMUSCULAR | Status: DC | PRN
  Administered 2014-11-24 (×2): 50 ug via INTRAVENOUS

## 2014-11-24 MED ORDER — NEOSTIGMINE METHYLSULFATE 10 MG/10ML IV SOLN
INTRAVENOUS | Status: DC | PRN
  Administered 2014-11-24: 3 mg via INTRAVENOUS

## 2014-11-24 MED ORDER — METOCLOPRAMIDE HCL 5 MG/ML IJ SOLN: 5.0000 mg | Freq: Three times a day (TID) | INTRAMUSCULAR | Status: DC | PRN

## 2014-11-24 MED ORDER — LIDOCAINE HCL (CARDIAC) 20 MG/ML IV SOLN
INTRAVENOUS | Status: AC
  Filled 2014-11-24: qty 5

## 2014-11-24 MED ORDER — CEFAZOLIN SODIUM 1-5 GM-% IV SOLN
1.0000 g | Freq: Four times a day (QID) | INTRAVENOUS | Status: AC
  Administered 2014-11-24 – 2014-11-25 (×3): 1 g via INTRAVENOUS
  Filled 2014-11-24 (×4): qty 50

## 2014-11-24 MED ORDER — FENTANYL CITRATE (PF) 250 MCG/5ML IJ SOLN
INTRAMUSCULAR | Status: AC
  Filled 2014-11-24: qty 5

## 2014-11-24 MED ORDER — SCOPOLAMINE 1 MG/3DAYS TD PT72
MEDICATED_PATCH | TRANSDERMAL | Status: AC
  Filled 2014-11-24: qty 1

## 2014-11-24 MED ORDER — BUPIVACAINE-EPINEPHRINE (PF) 0.5% -1:200000 IJ SOLN
INTRAMUSCULAR | Status: DC | PRN
  Administered 2014-11-24: 30 mL via PERINEURAL

## 2014-11-24 MED ORDER — MENTHOL 3 MG MT LOZG: 1.0000 | LOZENGE | OROMUCOSAL | Status: DC | PRN

## 2014-11-24 MED ORDER — ACETAMINOPHEN 650 MG RE SUPP: 650.0000 mg | Freq: Four times a day (QID) | RECTAL | Status: DC | PRN

## 2014-11-24 MED ORDER — MAGNESIUM CITRATE PO SOLN: 1.0000 | Freq: Once | ORAL | Status: AC | PRN

## 2014-11-24 MED ORDER — SCOPOLAMINE 1 MG/3DAYS TD PT72
MEDICATED_PATCH | TRANSDERMAL | Status: DC | PRN
  Administered 2014-11-24: 1 via TRANSDERMAL

## 2014-11-24 MED ORDER — DEXAMETHASONE SODIUM PHOSPHATE 4 MG/ML IJ SOLN
INTRAMUSCULAR | Status: DC | PRN
  Administered 2014-11-24: 8 mg via INTRAVENOUS

## 2014-11-24 MED ORDER — ONDANSETRON HCL 4 MG/2ML IJ SOLN
INTRAMUSCULAR | Status: DC | PRN
  Administered 2014-11-24: 4 mg via INTRAVENOUS

## 2014-11-24 MED ORDER — DEXTROSE 5 % IV SOLN
500.0000 mg | Freq: Four times a day (QID) | INTRAVENOUS | Status: DC | PRN
  Filled 2014-11-24: qty 5

## 2014-11-24 MED ORDER — MIDAZOLAM HCL 2 MG/2ML IJ SOLN
INTRAMUSCULAR | Status: AC
  Filled 2014-11-24: qty 2

## 2014-11-24 MED ORDER — LACTATED RINGERS IV SOLN
INTRAVENOUS | Status: DC | PRN
  Administered 2014-11-24 (×3): via INTRAVENOUS

## 2014-11-24 MED ORDER — ROCURONIUM BROMIDE 100 MG/10ML IV SOLN
INTRAVENOUS | Status: DC | PRN
  Administered 2014-11-24: 30 mg via INTRAVENOUS

## 2014-11-24 MED ORDER — BISACODYL 10 MG RE SUPP: 10.0000 mg | Freq: Every day | RECTAL | Status: DC | PRN

## 2014-11-24 MED ORDER — MIDAZOLAM HCL 2 MG/2ML IJ SOLN
INTRAMUSCULAR | Status: DC | PRN
Start: 2014-11-24 — End: 2014-11-24
  Administered 2014-11-24: 2 mg via INTRAVENOUS

## 2014-11-24 MED ORDER — HYDROMORPHONE HCL 1 MG/ML IJ SOLN: 0.2500 mg | INTRAMUSCULAR | Status: DC | PRN

## 2014-11-24 MED ORDER — ALUM & MAG HYDROXIDE-SIMETH 200-200-20 MG/5ML PO SUSP: 30.0000 mL | ORAL | Status: DC | PRN

## 2014-11-24 MED ORDER — PROPOFOL 10 MG/ML IV BOLUS
INTRAVENOUS | Status: DC | PRN
  Administered 2014-11-24: 180 mg via INTRAVENOUS

## 2014-11-24 MED ORDER — DEXAMETHASONE SODIUM PHOSPHATE 4 MG/ML IJ SOLN
INTRAMUSCULAR | Status: AC
  Filled 2014-11-24: qty 2

## 2014-11-24 MED ORDER — PROMETHAZINE HCL 25 MG/ML IJ SOLN: 6.2500 mg | INTRAMUSCULAR | Status: DC | PRN

## 2014-11-24 MED ORDER — 0.9 % SODIUM CHLORIDE (POUR BTL) OPTIME
TOPICAL | Status: DC | PRN
  Administered 2014-11-24: 1000 mL

## 2014-11-24 MED ORDER — ENOXAPARIN SODIUM 40 MG/0.4ML ~~LOC~~ SOLN
40.0000 mg | SUBCUTANEOUS | Status: DC
  Administered 2014-11-25 – 2014-11-26 (×2): 40 mg via SUBCUTANEOUS
  Filled 2014-11-24 (×2): qty 0.4

## 2014-11-24 MED ORDER — GLYCOPYRROLATE 0.2 MG/ML IJ SOLN
INTRAMUSCULAR | Status: DC | PRN
  Administered 2014-11-24: .4 mg via INTRAVENOUS

## 2014-11-24 MED ORDER — SENNA 8.6 MG PO TABS
1.0000 | ORAL_TABLET | Freq: Two times a day (BID) | ORAL | Status: DC
  Administered 2014-11-24 – 2014-11-26 (×4): 8.6 mg via ORAL
  Filled 2014-11-24 (×4): qty 1

## 2014-11-24 MED ORDER — ROCURONIUM BROMIDE 50 MG/5ML IV SOLN
INTRAVENOUS | Status: AC
  Filled 2014-11-24: qty 1

## 2014-11-24 MED ORDER — DOCUSATE SODIUM 100 MG PO CAPS
100.0000 mg | ORAL_CAPSULE | Freq: Two times a day (BID) | ORAL | Status: DC
  Administered 2014-11-24 – 2014-11-26 (×4): 100 mg via ORAL
  Filled 2014-11-24 (×4): qty 1

## 2014-11-24 MED ORDER — METOCLOPRAMIDE HCL 5 MG PO TABS: 5.0000 mg | ORAL_TABLET | Freq: Three times a day (TID) | ORAL | Status: DC | PRN

## 2014-11-24 MED ORDER — PROPOFOL 10 MG/ML IV BOLUS
INTRAVENOUS | Status: AC
  Filled 2014-11-24: qty 20

## 2014-11-24 MED ORDER — PHENOL 1.4 % MT LIQD: 1.0000 | OROMUCOSAL | Status: DC | PRN

## 2014-11-24 MED ORDER — ACETAMINOPHEN 325 MG PO TABS: 650.0000 mg | ORAL_TABLET | Freq: Four times a day (QID) | ORAL | Status: DC | PRN

## 2014-11-24 MED ORDER — POLYETHYLENE GLYCOL 3350 17 G PO PACK: 17.0000 g | PACK | Freq: Every day | ORAL | Status: DC | PRN

## 2014-11-24 MED ORDER — METHOCARBAMOL 500 MG PO TABS
500.0000 mg | ORAL_TABLET | Freq: Four times a day (QID) | ORAL | Status: DC | PRN
  Administered 2014-11-25 – 2014-11-26 (×4): 500 mg via ORAL
  Filled 2014-11-24 (×4): qty 1

## 2014-11-24 SURGICAL SUPPLY — 81 items
ADH SKN CLS APL DERMABOND .7 (GAUZE/BANDAGES/DRESSINGS) ×1
APL SKNCLS STERI-STRIP NONHPOA (GAUZE/BANDAGES/DRESSINGS) ×1
BANDAGE ELASTIC 3 VELCRO ST LF (GAUZE/BANDAGES/DRESSINGS) ×2 IMPLANT
BANDAGE ELASTIC 4 VELCRO ST LF (GAUZE/BANDAGES/DRESSINGS) ×2 IMPLANT
BENZOIN TINCTURE PRP APPL 2/3 (GAUZE/BANDAGES/DRESSINGS) ×2 IMPLANT
BIT DRILL 2.0 LNG QUCK RELEASE (BIT) IMPLANT
BIT DRILL 2.8 QUICK RELEASE (BIT) IMPLANT
BLADE SURG ROTATE 9660 (MISCELLANEOUS) IMPLANT
BNDG CMPR 9X4 STRL LF SNTH (GAUZE/BANDAGES/DRESSINGS) ×1
BNDG ESMARK 4X9 LF (GAUZE/BANDAGES/DRESSINGS) ×2 IMPLANT
CLSR STERI-STRIP ANTIMIC 1/2X4 (GAUZE/BANDAGES/DRESSINGS) ×2 IMPLANT
CORDS BIPOLAR (ELECTRODE) ×2 IMPLANT
COVER SURGICAL LIGHT HANDLE (MISCELLANEOUS) ×3 IMPLANT
CUFF TOURNIQUET SINGLE 18IN (TOURNIQUET CUFF) ×2 IMPLANT
CUFF TOURNIQUET SINGLE 24IN (TOURNIQUET CUFF) IMPLANT
DECANTER SPIKE VIAL GLASS SM (MISCELLANEOUS) IMPLANT
DERMABOND ADVANCED (GAUZE/BANDAGES/DRESSINGS) ×1
DERMABOND ADVANCED .7 DNX12 (GAUZE/BANDAGES/DRESSINGS) IMPLANT
DRAPE INCISE IOBAN 66X45 STRL (DRAPES) IMPLANT
DRAPE OEC MINIVIEW 54X84 (DRAPES) IMPLANT
DRAPE U-SHAPE 47X51 STRL (DRAPES) ×2 IMPLANT
DRILL 2.0 LNG QUICK RELEASE (BIT) ×2
DRILL 2.8 QUICK RELEASE (BIT) ×2
DRSG PAD ABDOMINAL 8X10 ST (GAUZE/BANDAGES/DRESSINGS) ×1 IMPLANT
ELECT REM PT RETURN 9FT ADLT (ELECTROSURGICAL)
ELECTRODE REM PT RTRN 9FT ADLT (ELECTROSURGICAL) IMPLANT
GAUZE SPONGE 4X4 12PLY STRL (GAUZE/BANDAGES/DRESSINGS) ×2 IMPLANT
GAUZE XEROFORM 1X8 LF (GAUZE/BANDAGES/DRESSINGS) ×2 IMPLANT
GAUZE XEROFORM 5X9 LF (GAUZE/BANDAGES/DRESSINGS) ×1 IMPLANT
GLOVE BIOGEL PI ORTHO PRO SZ8 (GLOVE) ×1
GLOVE ORTHO TXT STRL SZ7.5 (GLOVE) ×2 IMPLANT
GLOVE PI ORTHO PRO STRL SZ8 (GLOVE) ×1 IMPLANT
GLOVE SS BIOGEL STRL SZ 8 (GLOVE) ×1 IMPLANT
GLOVE SUPERSENSE BIOGEL SZ 8 (GLOVE) ×1
GLOVE SURG ORTHO 8.0 STRL STRW (GLOVE) ×4 IMPLANT
GOWN STRL REUS W/ TWL LRG LVL3 (GOWN DISPOSABLE) IMPLANT
GOWN STRL REUS W/TWL LRG LVL3 (GOWN DISPOSABLE)
GUIDEWIRE ORTH 6X062XTROC NS (WIRE) IMPLANT
GUIDEWIRE ORTHO 2.0X9 ST (WIRE) ×1 IMPLANT
K-WIRE .062 (WIRE) ×2
KIT BASIN OR (CUSTOM PROCEDURE TRAY) ×2 IMPLANT
KIT ROOM TURNOVER OR (KITS) ×2 IMPLANT
LOOP VESSEL MAXI BLUE (MISCELLANEOUS) ×2 IMPLANT
MANIFOLD NEPTUNE II (INSTRUMENTS) ×2 IMPLANT
NDL BLUNT 16X1.5 OR ONLY (NEEDLE) IMPLANT
NEEDLE 22X1 1/2 (OR ONLY) (NEEDLE) IMPLANT
NEEDLE BLUNT 16X1.5 OR ONLY (NEEDLE) IMPLANT
NS IRRIG 1000ML POUR BTL (IV SOLUTION) ×3 IMPLANT
PACK ORTHO EXTREMITY (CUSTOM PROCEDURE TRAY) ×2 IMPLANT
PAD ARMBOARD 7.5X6 YLW CONV (MISCELLANEOUS) ×4 IMPLANT
PAD CAST 4YDX4 CTTN HI CHSV (CAST SUPPLIES) ×1 IMPLANT
PADDING CAST COTTON 4X4 STRL (CAST SUPPLIES) ×2
PLATE OLECRANON 5HOLE LEFT STD (Plate) ×1 IMPLANT
SCREW CORTICAL 3.5X20MM (Screw) ×1 IMPLANT
SCREW HEX NON LOCK 3.5X26MM (Screw) ×1 IMPLANT
SCREW HEXALOBE LOCK 3.5X50MM (Screw) ×1 IMPLANT
SCREW LOCK 18X2.7X HEXALOBE (Screw) IMPLANT
SCREW LOCK 20X2.7X HEXALOBE (Screw) IMPLANT
SCREW LOCK 22X2.7X HEXALOBE (Screw) IMPLANT
SCREW LOCKING 2.7X18MM (Screw) ×2 IMPLANT
SCREW LOCKING 2.7X20MM (Screw) ×2 IMPLANT
SCREW LOCKING 2.7X22MM (Screw) ×4 IMPLANT
SCREW NON LOCKING HEX 3.5X24 (Screw) ×1 IMPLANT
SCREW NONLOCK HEX 2.7X18MM (Screw) ×1 IMPLANT
SLING ARM IMMOBILIZER LRG (SOFTGOODS) ×1 IMPLANT
SPLINT PLASTER CAST XFAST 5X30 (CAST SUPPLIES) IMPLANT
SPLINT PLASTER XFAST SET 5X30 (CAST SUPPLIES) ×1
SPONGE LAP 4X18 X RAY DECT (DISPOSABLE) ×2 IMPLANT
STAPLER VISISTAT 35W (STAPLE) IMPLANT
SUCTION FRAZIER TIP 10 FR DISP (SUCTIONS) ×2 IMPLANT
SUT ETHILON 4 0 PS 2 18 (SUTURE) IMPLANT
SUT PROLENE 4 0 PS 2 18 (SUTURE) IMPLANT
SUT VIC AB 0 CT1 27 (SUTURE) ×10
SUT VIC AB 0 CT1 27XBRD ANBCTR (SUTURE) IMPLANT
SUT VIC AB 3-0 FS2 27 (SUTURE) ×1 IMPLANT
SYR CONTROL 10ML LL (SYRINGE) IMPLANT
TOWEL OR 17X24 6PK STRL BLUE (TOWEL DISPOSABLE) ×2 IMPLANT
TOWEL OR 17X26 10 PK STRL BLUE (TOWEL DISPOSABLE) ×2 IMPLANT
TUBE CONNECTING 12X1/4 (SUCTIONS) ×2 IMPLANT
WATER STERILE IRR 1000ML POUR (IV SOLUTION) ×2 IMPLANT
YANKAUER SUCT BULB TIP NO VENT (SUCTIONS) IMPLANT

## 2014-11-24 NOTE — Transfer of Care (Signed)
Immediate Anesthesia Transfer of Care Note  Patient: Kyle Reese  Procedure(s) Performed: Procedure(s): OPEN REDUCTION INTERNAL FIXATION (ORIF) OLECRANON (Left)  Patient Location: PACU  Anesthesia Type:General  Level of Consciousness: sedated  Airway & Oxygen Therapy: Patient Spontanous Breathing and Patient connected to nasal cannula oxygen  Post-op Assessment: Report given to RN and Post -op Vital signs reviewed and stable  Post vital signs: Reviewed and stable  Last Vitals:  Filed Vitals:   11/24/14 0800  BP:   Pulse:   Temp:   Resp: 20    Complications: No apparent anesthesia complications

## 2014-11-24 NOTE — Anesthesia Preprocedure Evaluation (Addendum)
Anesthesia Evaluation  Patient identified by MRN, date of birth, ID band Patient awake    Reviewed: Allergy & Precautions, NPO status , Patient's Chart, lab work & pertinent test results  History of Anesthesia Complications Negative for: history of anesthetic complications  Airway Mallampati: II  TM Distance: >3 FB Neck ROM: Full    Dental  (+) Teeth Intact, Dental Advisory Given   Pulmonary neg pulmonary ROS,    Pulmonary exam normal       Cardiovascular negative cardio ROS Normal cardiovascular exam    Neuro/Psych negative neurological ROS  negative psych ROS   GI/Hepatic negative GI ROS, Neg liver ROS,   Endo/Other  negative endocrine ROS  Renal/GU negative Renal ROS     Musculoskeletal   Abdominal   Peds  Hematology   Anesthesia Other Findings   Reproductive/Obstetrics                           Anesthesia Physical Anesthesia Plan  ASA: II  Anesthesia Plan: General   Post-op Pain Management:    Induction: Intravenous  Airway Management Planned: Oral ETT  Additional Equipment:   Intra-op Plan:   Post-operative Plan: Extubation in OR  Informed Consent: I have reviewed the patients History and Physical, chart, labs and discussed the procedure including the risks, benefits and alternatives for the proposed anesthesia with the patient or authorized representative who has indicated his/her understanding and acceptance.   Dental advisory given  Plan Discussed with: CRNA, Anesthesiologist and Surgeon  Anesthesia Plan Comments:        Anesthesia Quick Evaluation

## 2014-11-24 NOTE — Anesthesia Procedure Notes (Addendum)
Anesthesia Regional Block:  Supraclavicular block  Pre-Anesthetic Checklist: ,, timeout performed, Correct Patient, Correct Site, Correct Laterality, Correct Procedure, Correct Position, site marked, Risks and benefits discussed,  Surgical consent,  Pre-op evaluation,  At surgeon's request and post-op pain management  Laterality: Left  Prep: chloraprep       Needles:  Injection technique: Single-shot  Needle Type: Echogenic Stimulator Needle     Needle Length: 5cm 5 cm Needle Gauge: 22 and 22 G    Additional Needles:  Procedures: ultrasound guided (picture in chart) and nerve stimulator Supraclavicular block  Nerve Stimulator or Paresthesia:  Response: bicep contraction, 0.48 mA,   Additional Responses:   Narrative:  Start time: 11/24/2014 11:39 AM End time: 11/24/2014 11:49 AM Injection made incrementally with aspirations every 5 mL.  Performed by: Personally   Additional Notes: Functioning IV was confirmed and monitors applied.  A 15mm 22ga echogenic arrow stimulator was used. Sterile prep and drape,hand hygiene and sterile gloves were used.Ultrasound guidance: relevent anatomy identified, needle position confirmed, local anesthetic spread visualized around nerve(s)., vascular puncture avoided.  Image printed for medical record.  Negative aspiration and negative test dose prior to incremental administration of local anesthetic. The patient tolerated the procedure well.   Procedure Name: LMA Insertion Date/Time: 11/24/2014 11:59 AM Performed by: Marinda Elk A Pre-anesthesia Checklist: Patient identified, Emergency Drugs available, Suction available, Patient being monitored and Timeout performed Patient Re-evaluated:Patient Re-evaluated prior to inductionOxygen Delivery Method: Circle system utilized Preoxygenation: Pre-oxygenation with 100% oxygen Intubation Type: IV induction LMA: LMA inserted LMA Size: 4.0 Number of attempts: 1 Placement Confirmation: positive  ETCO2 and breath sounds checked- equal and bilateral Tube secured with: Tape Dental Injury: Teeth and Oropharynx as per pre-operative assessment     Procedure Name: Intubation Date/Time: 11/24/2014 12:30 PM Performed by: Marinda Elk A Pre-anesthesia Checklist: Patient identified, Emergency Drugs available, Suction available, Patient being monitored and Timeout performed Patient Re-evaluated:Patient Re-evaluated prior to inductionOxygen Delivery Method: Circle system utilized Preoxygenation: Pre-oxygenation with 100% oxygen Intubation Type: IV induction Laryngoscope Size: Mac and 3 Grade View: Grade II Tube type: Oral Tube size: 7.5 mm Number of attempts: 1 Airway Equipment and Method: Stylet Placement Confirmation: ETT inserted through vocal cords under direct vision,  positive ETCO2 and breath sounds checked- equal and bilateral Secured at: 22 cm Tube secured with: Tape Dental Injury: Teeth and Oropharynx as per pre-operative assessment  Comments: With pt in lateral pos.

## 2014-11-24 NOTE — Progress Notes (Signed)
Pt for ORIF left elbow with consent, NPO midnight, no s/s of distress at this time.

## 2014-11-24 NOTE — Progress Notes (Signed)
The patient has been re-examined, and the chart reviewed, and there have been no interval changes to the documented history and physical.    The risks, benefits, and alternatives have been discussed at length, and the patient is willing to proceed.    Johnny Bridge, MD

## 2014-11-24 NOTE — Progress Notes (Signed)
Pt complaining of numbness on his left arm s/p left elbow ORIF Dr. Mardelle Matte informed and said he has nerve block will continue monitor , pt can move his fingers, warm to touch, nail capillary 2< sec, given pain meds, on ace wrapped with sling, will continue to monitor.
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
## 2014-11-24 NOTE — Anesthesia Postprocedure Evaluation (Signed)
Anesthesia Post Note  Patient: Kyle Reese  Procedure(s) Performed: Procedure(s) (LRB): OPEN REDUCTION INTERNAL FIXATION (ORIF) OLECRANON (Left)  Anesthesia type: general  Patient location: PACU  Post pain: Pain level controlled  Post assessment: Patient's Cardiovascular Status Stable  Last Vitals:  Filed Vitals:   11/24/14 1505  BP: 129/74  Pulse: 68  Temp: 37 C  Resp: 15    Post vital signs: Reviewed and stable  Level of consciousness: sedated  Complications: No apparent anesthesia complications
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
## 2014-11-25 ENCOUNTER — Encounter (HOSPITAL_COMMUNITY): Payer: Self-pay | Admitting: Orthopedic Surgery

## 2014-11-25 DIAGNOSIS — D62 Acute posthemorrhagic anemia: Secondary | ICD-10-CM | POA: Diagnosis not present

## 2014-11-25 DIAGNOSIS — S2242XA Multiple fractures of ribs, left side, initial encounter for closed fracture: Secondary | ICD-10-CM | POA: Diagnosis present

## 2014-11-25 LAB — CBC
HEMATOCRIT: 33.2 % — AB (ref 39.0–52.0)
HEMOGLOBIN: 11.2 g/dL — AB (ref 13.0–17.0)
MCH: 32.5 pg (ref 26.0–34.0)
MCHC: 33.7 g/dL (ref 30.0–36.0)
MCV: 96.2 fL (ref 78.0–100.0)
PLATELETS: 125 10*3/uL — AB (ref 150–400)
RBC: 3.45 MIL/uL — ABNORMAL LOW (ref 4.22–5.81)
RDW: 13.7 % (ref 11.5–15.5)
WBC: 6.8 10*3/uL (ref 4.0–10.5)

## 2014-11-25 LAB — BASIC METABOLIC PANEL
ANION GAP: 3 — AB (ref 5–15)
BUN: 10 mg/dL (ref 6–20)
CALCIUM: 7.6 mg/dL — AB (ref 8.9–10.3)
CHLORIDE: 104 mmol/L (ref 101–111)
CO2: 28 mmol/L (ref 22–32)
Creatinine, Ser: 0.91 mg/dL (ref 0.61–1.24)
GFR calc non Af Amer: >60 mL/min (ref >60)
Glucose, Bld: 143 mg/dL — ABNORMAL HIGH (ref 65–99)
Potassium: 3.9 mmol/L (ref 3.5–5.1)
SODIUM: 135 mmol/L (ref 135–145)

## 2014-11-25 MED ORDER — NAPROXEN 250 MG PO TABS
500.0000 mg | ORAL_TABLET | Freq: Two times a day (BID) | ORAL | Status: DC
  Administered 2014-11-25 – 2014-11-26 (×3): 500 mg via ORAL
  Filled 2014-11-25 (×3): qty 2

## 2014-11-25 MED ORDER — IPRATROPIUM-ALBUTEROL 0.5-2.5 (3) MG/3ML IN SOLN
3.0000 mL | Freq: Four times a day (QID) | RESPIRATORY_TRACT | Status: DC
  Administered 2014-11-25 – 2014-11-26 (×3): 3 mL via RESPIRATORY_TRACT
  Filled 2014-11-25 (×5): qty 3
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
## 2014-11-25 NOTE — Progress Notes (Signed)
Patient voided 100 cc. Bladder scan 119. Will continue to monitor patient.
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
## 2014-11-25 NOTE — Progress Notes (Signed)
Pt said less numbness he felt in his left arm, can move his fingers, warm to touch, < 2 sec nail capillary given pain meds and muscle spasm as ordered.,
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
## 2014-11-26 MED ORDER — METHOCARBAMOL 500 MG PO TABS: 500.0000 mg | ORAL_TABLET | Freq: Four times a day (QID) | ORAL | Status: DC | PRN

## 2014-11-26 MED ORDER — OXYCODONE-ACETAMINOPHEN 5-325 MG PO TABS: 1.0000 | ORAL_TABLET | ORAL | Status: DC | PRN

## 2014-11-26 MED ORDER — NAPROXEN 500 MG PO TABS: 500.0000 mg | ORAL_TABLET | Freq: Two times a day (BID) | ORAL | Status: DC
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
## 2014-11-26 NOTE — Progress Notes (Signed)
Patient d/c this morning, d/c instructions given, all questions answered. Awaiting on transportation. All assessments remains unchanged as at now.
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
## 2014-11-26 NOTE — Discharge Instructions (Signed)
Diet: As you were doing prior to hospitalization   Shower:  May shower but keep the wounds dry, use an occlusive plastic wrap, NO SOAKING IN TUB.  If the bandage gets wet, change with a clean dry gauze.  If you have a splint on, leave the splint in place and keep the splint dry with a plastic bag.  Dressing:  Leave the splint in place and we will change your bandages during your first follow-up appointment.    Activity:  Increase activity slowly as tolerated, but follow the weight bearing instructions below.  The rules on driving is that you can not be taking narcotics while you drive, and you must feel in control of the vehicle.    Weight Bearing:   No use of left elbow or shoulder, ok for hand and wrist motion, no lifting..    To prevent constipation: you may use a stool softener such as -  Colace (over the counter) 100 mg by mouth twice a day  Drink plenty of fluids (prune juice may be helpful) and high fiber foods Miralax (over the counter) for constipation as needed.    Itching:  If you experience itching with your medications, try taking only a single pain pill, or even half a pain pill at a time.  You may take up to 10 pain pills per day, and you can also use benadryl over the counter for itching or also to help with sleep.   Precautions:  If you experience chest pain or shortness of breath - call 911 immediately for transfer to the hospital emergency department!!  If you develop a fever greater that 101 F, purulent drainage from wound, increased redness or drainage from wound, or calf pain -- Call the office at [phone]                                                Follow- Up Appointment:  Please call for an appointment to be seen in 2 weeks [place] - [phone]

## 2014-11-26 NOTE — Progress Notes (Signed)
Patient ID: Kyle Reese, male   DOB: 1956/04/02, 59 y.o.   MRN: [id number]   LOS: 3 days   Subjective: No change overnight. Ready to go home.   Objective: Vital signs in last 24 hours: Temp:  [97.7 F (36.5 C)-98.4 F (36.9 C)] 98.4 F (36.9 C) (07/26 0534) Pulse Rate:  [52-64] 56 (07/26 0534) Resp:  [16] 16 (07/26 0129) BP: (117-123)/(71-77) 117/71 mmHg (07/26 0534) SpO2:  [82 %-95 %] 95 % (07/26 0534) Last BM Date: 11/22/14   IS: 1230ml (+588ml)   Physical Exam General appearance: alert and no distress Resp: clear to auscultation bilaterally Cardio: regular rate and rhythm GI: normal findings: bowel sounds normal and soft, non-tender Extremities: Left hand edematous, NT   Assessment/Plan: BCC Left clav fx Multiple left rib fxs -- Pulmonary toilet Left elbow fx s/p ORIF -- per Dr. Mardelle Matte ABL anemia -- Mild Dispo -- Home     [name], PA-C Pager: [phone] General Trauma PA Pager: [phone]  11/26/2014
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
## 2014-11-26 NOTE — Progress Notes (Signed)
Patient discharged to home. D/C instructions reviewed earlier. No questions at present. Assisted to dress. Accompanied by wife.
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
## 2015-12-11 ENCOUNTER — Ambulatory Visit (INDEPENDENT_AMBULATORY_CARE_PROVIDER_SITE_OTHER): Payer: BLUE CROSS/BLUE SHIELD | Admitting: Sports Medicine

## 2015-12-11 DIAGNOSIS — M79604 Pain in right leg: Secondary | ICD-10-CM

## 2015-12-11 DIAGNOSIS — M76899 Other specified enthesopathies of unspecified lower limb, excluding foot: Secondary | ICD-10-CM

## 2015-12-11 DIAGNOSIS — M7552 Bursitis of left shoulder: Secondary | ICD-10-CM

## 2015-12-11 DIAGNOSIS — M6580 Other synovitis and tenosynovitis, unspecified site: Secondary | ICD-10-CM

## 2015-12-11 DIAGNOSIS — M7550 Bursitis of unspecified shoulder: Secondary | ICD-10-CM | POA: Insufficient documentation

## 2015-12-11 MED ORDER — NITROGLYCERIN 0.2 MG/HR TD PT24: MEDICATED_PATCH | TRANSDERMAL | 1 refills | Status: DC
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
## 2015-12-11 NOTE — Assessment & Plan Note (Signed)
Cont ibuprofen as needed Cont easy motion exercise Modify wt lifting until better

## 2015-12-11 NOTE — Patient Instructions (Signed)

## 2015-12-11 NOTE — Assessment & Plan Note (Signed)
Given HEP Start NTG protocol Comp sleeve  Reck if not resolved in 6 wks

## 2016-07-13 ENCOUNTER — Other Ambulatory Visit: Payer: Self-pay | Admitting: Internal Medicine

## 2016-07-13 ENCOUNTER — Ambulatory Visit
Admission: RE | Admit: 2016-07-13 | Discharge: 2016-07-13 | Disposition: A | Discharge status: Home / Self Care | Payer: PRIVATE HEALTH INSURANCE | Source: Ambulatory Visit | Attending: Internal Medicine | Admitting: Internal Medicine

## 2016-07-13 DIAGNOSIS — Z0000 Encounter for general adult medical examination without abnormal findings: Secondary | ICD-10-CM

## 2016-12-29 ENCOUNTER — Ambulatory Visit (INDEPENDENT_AMBULATORY_CARE_PROVIDER_SITE_OTHER): Payer: PRIVATE HEALTH INSURANCE

## 2016-12-29 ENCOUNTER — Ambulatory Visit (INDEPENDENT_AMBULATORY_CARE_PROVIDER_SITE_OTHER): Payer: PRIVATE HEALTH INSURANCE | Admitting: Podiatry

## 2016-12-29 ENCOUNTER — Other Ambulatory Visit: Payer: Self-pay | Admitting: Podiatry

## 2016-12-29 ENCOUNTER — Encounter: Payer: Self-pay | Admitting: Podiatry

## 2016-12-29 DIAGNOSIS — S92501A Displaced unspecified fracture of right lesser toe(s), initial encounter for closed fracture: Secondary | ICD-10-CM

## 2016-12-29 DIAGNOSIS — M79671 Pain in right foot: Secondary | ICD-10-CM

## 2016-12-29 NOTE — Progress Notes (Signed)
Subjective:    Patient ID: Kyle Reese, male   DOB: 61 y.o.   MRN: [id number]   HPI patient presents stating he may have broken the toe    ROS      Objective:  Physical Exam neurovascular status intact negative Homans sign was noted with patient noted to have ecchymosis of the lesser digits right with quite a bit of discomfort on the head of the proximal phalanx digit 5 right     Assessment:   Probable fracture of the fifth digit right foot      Plan:   H&P x-rays reviewed and at this point I have recommended buddy splinting and explained fracture digital probably take 8-12 weeks to heal  X-ray indicates fracture of the base the proximal phalanx digit 5 right with no current joint involvement

## 2017-02-14 ENCOUNTER — Other Ambulatory Visit: Payer: Self-pay | Admitting: Neurosurgery

## 2017-02-14 ENCOUNTER — Ambulatory Visit
Admission: RE | Admit: 2017-02-14 | Discharge: 2017-02-14 | Disposition: A | Discharge status: Home / Self Care | Payer: Self-pay | Source: Ambulatory Visit | Attending: Neurosurgery | Admitting: Neurosurgery

## 2017-02-14 DIAGNOSIS — M542 Cervicalgia: Secondary | ICD-10-CM

## 2017-10-26 IMAGING — DX DG CERVICAL SPINE 2 OR 3 VIEWS
3 series · 3 of 3 positions shown · non-contrast
Comparison: 03/23/2013

CLINICAL DATA: Chronic neck pain.

EXAM:
CERVICAL SPINE - 2-3 VIEW

[dg cervical spine 2 or 3 views (1 of 3)]
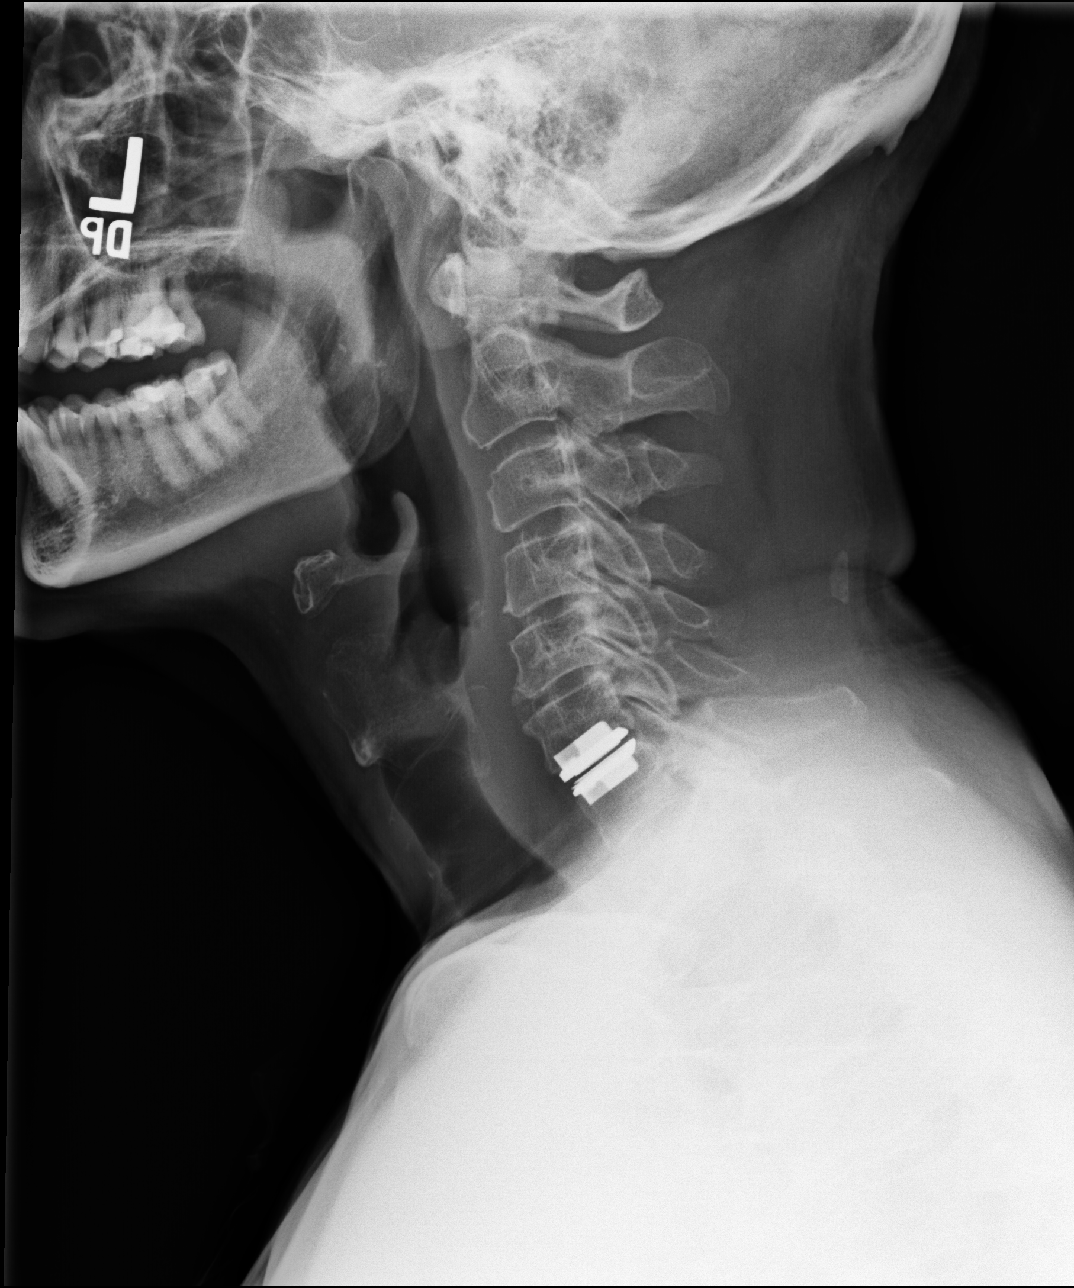

[dg cervical spine 2 or 3 views (2 of 3)]
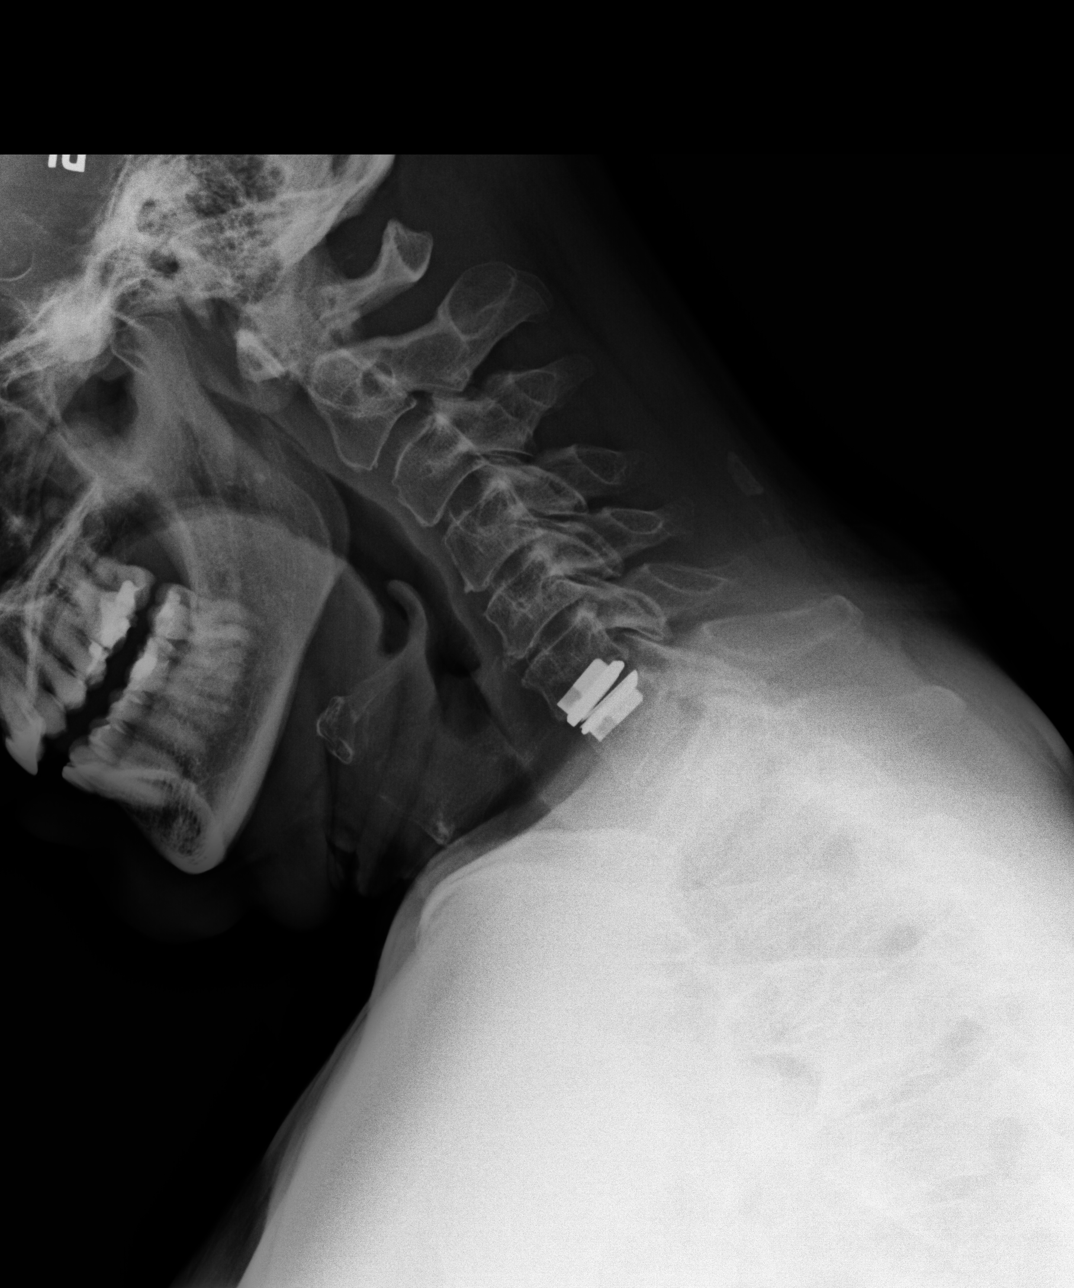

[dg cervical spine 2 or 3 views (3 of 3)]
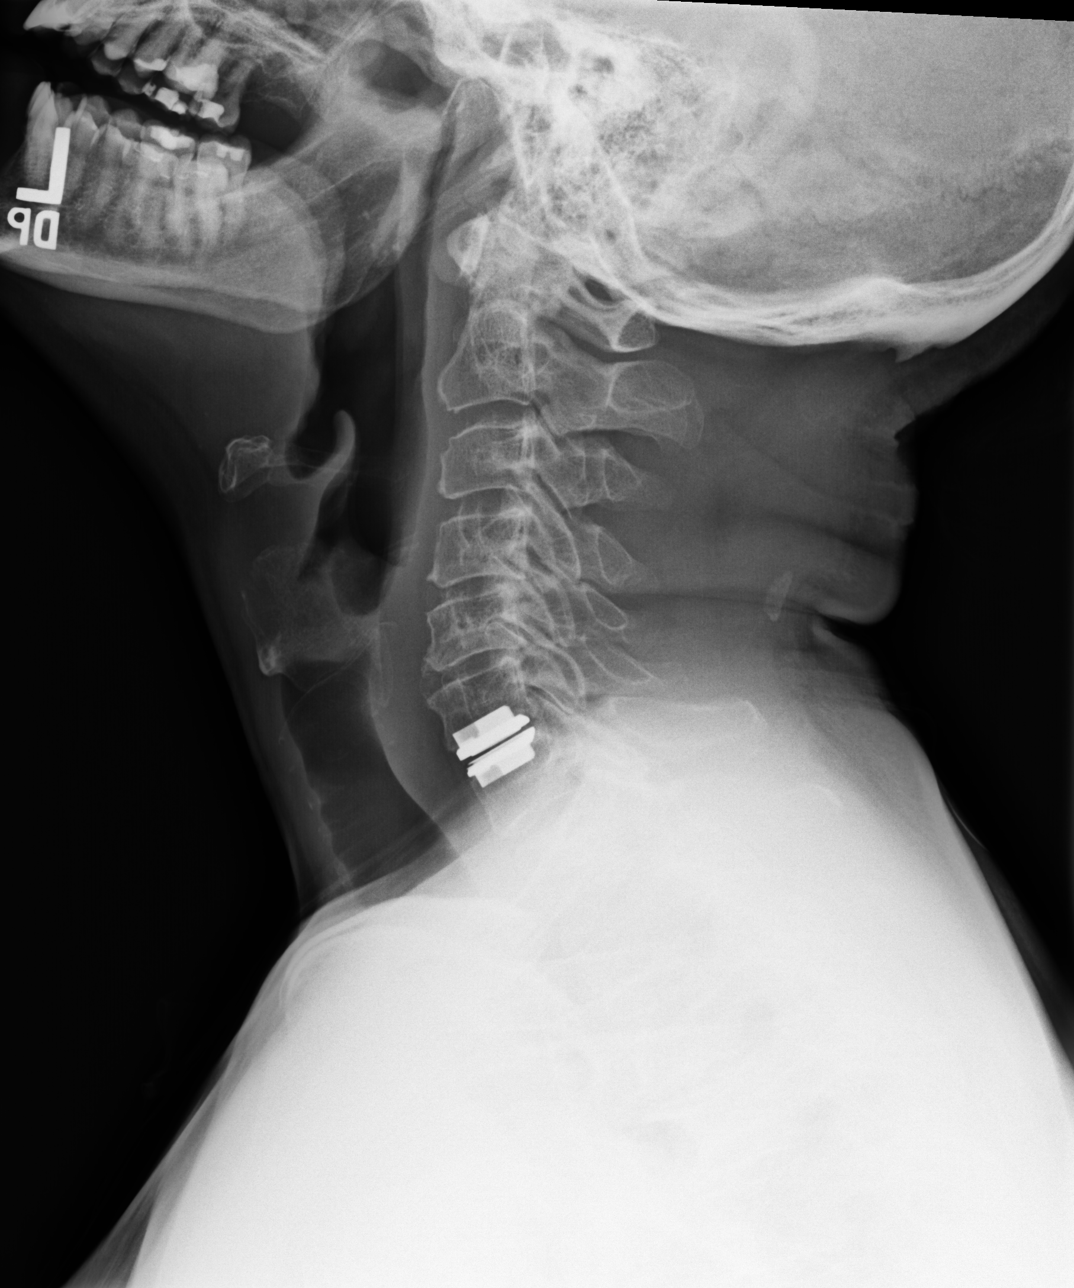

[3 of 3 positions shown; findings below may reference images not displayed]

FINDINGS: Lateral images in the neutral, flexion, and extension positions.

Prosthetic disc at C6-7 is unchanged from the prior study.

Mild retrolisthesis at C4-5 and C5-6 in the neutral position has
progressed. This does not change significantly on flexion or
extension. Progressive disc degeneration and spurring at C4-5 and
C5-6 since the prior study.

Negative for fracture or mass. Mild cervical kyphosis at C3-4.
Prevertebral soft tissues normal.
IMPRESSION: Progressive disc degeneration and spurring at C4-5 and C5-6.
Progressive retrolisthesis at C4-5 and C5-6 since the prior study.

Prosthetic disc at C6-7 unchanged

No abnormal movement on flexion or extension.

## 2018-03-16 ENCOUNTER — Other Ambulatory Visit: Payer: Self-pay | Admitting: Sports Medicine

## 2018-03-16 ENCOUNTER — Ambulatory Visit
Admission: RE | Admit: 2018-03-16 | Discharge: 2018-03-16 | Disposition: A | Discharge status: Home / Self Care | Payer: 59 | Source: Ambulatory Visit | Attending: Sports Medicine | Admitting: Sports Medicine

## 2018-03-16 DIAGNOSIS — I82412 Acute embolism and thrombosis of left femoral vein: Secondary | ICD-10-CM | POA: Diagnosis not present

## 2018-03-16 DIAGNOSIS — M25572 Pain in left ankle and joints of left foot: Secondary | ICD-10-CM | POA: Diagnosis present

## 2018-03-16 DIAGNOSIS — I82432 Acute embolism and thrombosis of left popliteal vein: Secondary | ICD-10-CM | POA: Insufficient documentation

## 2018-03-16 DIAGNOSIS — I824Z2 Acute embolism and thrombosis of unspecified deep veins of left distal lower extremity: Secondary | ICD-10-CM | POA: Insufficient documentation

## 2018-04-13 DIAGNOSIS — M25572 Pain in left ankle and joints of left foot: Secondary | ICD-10-CM | POA: Diagnosis not present

## 2018-04-13 DIAGNOSIS — S82822D Torus fracture of lower end of left fibula, subsequent encounter for fracture with routine healing: Secondary | ICD-10-CM | POA: Diagnosis not present

## 2018-04-17 DIAGNOSIS — I82402 Acute embolism and thrombosis of unspecified deep veins of left lower extremity: Secondary | ICD-10-CM | POA: Diagnosis not present

## 2018-04-17 DIAGNOSIS — Z79899 Other long term (current) drug therapy: Secondary | ICD-10-CM | POA: Diagnosis not present

## 2019-02-26 DIAGNOSIS — R030 Elevated blood-pressure reading, without diagnosis of hypertension: Secondary | ICD-10-CM | POA: Diagnosis not present

## 2019-02-26 DIAGNOSIS — E559 Vitamin D deficiency, unspecified: Secondary | ICD-10-CM | POA: Diagnosis not present

## 2019-02-26 DIAGNOSIS — E782 Mixed hyperlipidemia: Secondary | ICD-10-CM | POA: Diagnosis not present

## 2019-02-26 DIAGNOSIS — Z0000 Encounter for general adult medical examination without abnormal findings: Secondary | ICD-10-CM | POA: Diagnosis not present

## 2019-02-26 DIAGNOSIS — Z79899 Other long term (current) drug therapy: Secondary | ICD-10-CM | POA: Diagnosis not present

## 2019-02-27 DIAGNOSIS — Z79899 Other long term (current) drug therapy: Secondary | ICD-10-CM | POA: Diagnosis not present

## 2019-02-27 DIAGNOSIS — R030 Elevated blood-pressure reading, without diagnosis of hypertension: Secondary | ICD-10-CM | POA: Diagnosis not present

## 2019-02-27 DIAGNOSIS — E782 Mixed hyperlipidemia: Secondary | ICD-10-CM | POA: Diagnosis not present

## 2019-02-27 DIAGNOSIS — E559 Vitamin D deficiency, unspecified: Secondary | ICD-10-CM | POA: Diagnosis not present

## 2019-02-27 DIAGNOSIS — Z125 Encounter for screening for malignant neoplasm of prostate: Secondary | ICD-10-CM | POA: Diagnosis not present

## 2019-05-18 ENCOUNTER — Ambulatory Visit: Payer: BC Managed Care – PPO | Attending: Internal Medicine

## 2019-05-18 DIAGNOSIS — Z23 Encounter for immunization: Secondary | ICD-10-CM | POA: Insufficient documentation

## 2019-05-18 NOTE — Progress Notes (Signed)
   Covid-19 Vaccination Clinic  Name:  Kyle Reese    MRN: [id number] DOB: 1956-04-30  05/18/2019  [name] was observed post Covid-19 immunization for 15 minutes without incidence. He was provided with Vaccine Information Sheet and instruction to access the V-Safe system.   [name] was instructed to call 911 with any severe reactions post vaccine: Marland Kitchen Difficulty breathing  . Swelling of your face and throat  . A fast heartbeat  . A bad rash all over your body  . Dizziness and weakness    Immunizations Administered    Name Date Dose VIS Date Route   Pfizer COVID-19 Vaccine 05/18/2019 12:04 PM 0.3 mL 04/13/2019 Intramuscular   Manufacturer: [place]   Lot: [id number]   [place]: [id number]

## 2019-06-07 ENCOUNTER — Ambulatory Visit: Payer: BC Managed Care – PPO | Attending: Internal Medicine

## 2019-06-07 ENCOUNTER — Ambulatory Visit: Payer: Self-pay

## 2019-06-07 DIAGNOSIS — Z23 Encounter for immunization: Secondary | ICD-10-CM | POA: Insufficient documentation

## 2019-06-07 NOTE — Progress Notes (Signed)
   Covid-19 Vaccination Clinic  Name:  TYGA KACH    MRN: [id number] DOB: August 16, 1955  06/07/2019  Mr. Hemsley was observed post Covid-19 immunization for 15 minutes without incidence. He was provided with Vaccine Information Sheet and instruction to access the V-Safe system.   Mr. Moccia was instructed to call 911 with any severe reactions post vaccine: Marland Kitchen Difficulty breathing  . Swelling of your face and throat  . A fast heartbeat  . A bad rash all over your body  . Dizziness and weakness    Immunizations Administered    Name Date Dose VIS Date Route   Pfizer COVID-19 Vaccine 06/07/2019  3:08 PM 0.3 mL 04/13/2019 Intramuscular   Manufacturer: [place]   Lot: [id number]   [place]: [id number]

## 2019-07-26 DIAGNOSIS — D225 Melanocytic nevi of trunk: Secondary | ICD-10-CM | POA: Diagnosis not present

## 2019-08-01 DIAGNOSIS — D485 Neoplasm of uncertain behavior of skin: Secondary | ICD-10-CM | POA: Diagnosis not present

## 2019-08-01 DIAGNOSIS — L821 Other seborrheic keratosis: Secondary | ICD-10-CM | POA: Diagnosis not present

## 2019-08-01 DIAGNOSIS — L905 Scar conditions and fibrosis of skin: Secondary | ICD-10-CM | POA: Diagnosis not present

## 2019-08-01 DIAGNOSIS — D1801 Hemangioma of skin and subcutaneous tissue: Secondary | ICD-10-CM | POA: Diagnosis not present

## 2019-08-28 DIAGNOSIS — E785 Hyperlipidemia, unspecified: Secondary | ICD-10-CM | POA: Diagnosis not present

## 2019-08-28 DIAGNOSIS — Z7689 Persons encountering health services in other specified circumstances: Secondary | ICD-10-CM | POA: Diagnosis not present

## 2019-08-28 DIAGNOSIS — I10 Essential (primary) hypertension: Secondary | ICD-10-CM | POA: Diagnosis not present

## 2019-08-28 DIAGNOSIS — Z79899 Other long term (current) drug therapy: Secondary | ICD-10-CM | POA: Diagnosis not present

## 2019-09-25 DIAGNOSIS — I10 Essential (primary) hypertension: Secondary | ICD-10-CM | POA: Diagnosis not present

## 2019-09-25 DIAGNOSIS — E785 Hyperlipidemia, unspecified: Secondary | ICD-10-CM | POA: Diagnosis not present

## 2019-11-16 DIAGNOSIS — I719 Aortic aneurysm of unspecified site, without rupture: Secondary | ICD-10-CM | POA: Diagnosis not present

## 2019-11-28 DIAGNOSIS — I719 Aortic aneurysm of unspecified site, without rupture: Secondary | ICD-10-CM | POA: Diagnosis not present

## 2019-12-05 ENCOUNTER — Other Ambulatory Visit: Payer: Self-pay | Admitting: Internal Medicine

## 2019-12-05 DIAGNOSIS — I7789 Other specified disorders of arteries and arterioles: Secondary | ICD-10-CM

## 2019-12-13 DIAGNOSIS — I712 Thoracic aortic aneurysm, without rupture: Secondary | ICD-10-CM | POA: Diagnosis not present

## 2019-12-13 DIAGNOSIS — I719 Aortic aneurysm of unspecified site, without rupture: Secondary | ICD-10-CM | POA: Diagnosis not present

## 2019-12-18 ENCOUNTER — Ambulatory Visit
Admission: RE | Admit: 2019-12-18 | Discharge: 2019-12-18 | Disposition: A | Discharge status: Home / Self Care | Payer: BC Managed Care – PPO | Source: Ambulatory Visit | Attending: Internal Medicine | Admitting: Internal Medicine

## 2019-12-18 DIAGNOSIS — Z86711 Personal history of pulmonary embolism: Secondary | ICD-10-CM | POA: Diagnosis not present

## 2019-12-18 DIAGNOSIS — I7789 Other specified disorders of arteries and arterioles: Secondary | ICD-10-CM

## 2019-12-18 MED ORDER — IOPAMIDOL (ISOVUE-370) INJECTION 76%
75.0000 mL | Freq: Once | INTRAVENOUS | Status: AC | PRN
  Administered 2019-12-18: 75 mL via INTRAVENOUS

## 2020-01-13 ENCOUNTER — Ambulatory Visit: Payer: BC Managed Care – PPO | Attending: Internal Medicine

## 2020-01-13 DIAGNOSIS — Z23 Encounter for immunization: Secondary | ICD-10-CM

## 2020-01-13 NOTE — Progress Notes (Signed)
   Covid-19 Vaccination Clinic  Name:  SHAMELL SUAREZ    MRN: [id number] DOB: 10/23/1955  01/13/2020  Mr. Blixt was observed post Covid-19 immunization for 15 minutes without incident. He was provided with Vaccine Information Sheet and instruction to access the V-Safe system.   Mr. Bloxham was instructed to call 911 with any severe reactions post vaccine: Marland Kitchen Difficulty breathing  . Swelling of face and throat  . A fast heartbeat  . A bad rash all over body  . Dizziness and weakness

## 2020-01-31 ENCOUNTER — Ambulatory Visit (INDEPENDENT_AMBULATORY_CARE_PROVIDER_SITE_OTHER): Payer: BC Managed Care – PPO | Admitting: Sports Medicine

## 2020-01-31 ENCOUNTER — Other Ambulatory Visit: Payer: Self-pay

## 2020-01-31 DIAGNOSIS — S39011A Strain of muscle, fascia and tendon of abdomen, initial encounter: Secondary | ICD-10-CM | POA: Insufficient documentation

## 2020-01-31 NOTE — Progress Notes (Signed)
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 He was doing declined sit ups when on the 25th sit up he felt a sharp pain in his right groin He has had tenderness in that area since then that only limits him if he tries to do straight leg lifts or sit ups No cough or sneeze pain  He has a concern because his father had bilateral inguinal hernias He comes for evaluation of this  Review of systems He did in the past year undergo a pretty significant cardiovascular work-up He was concerned about irregular beats and atrial fibrillation  Complete work-up showed very normal cardiac findings  Physical exam Muscular male in no acute distress [place] Adult Exercise 01/31/2020  Frequency of aerobic exercise (# of days/week) 6  Average time in minutes 60  Frequency of strengthening activities (# of days/week) 2   BP 128/82   Ht 5\' 8"  (1.727 m)   Wt 175 lb (79.4 kg)   BMI 26.61 kg/m   Patient has an area of point tenderness along the midportion of the inguinal ligament on the right Inguinal hernia check was normal and the testicle on that side was normal as well Femoral pulse was normal and no abnormal lymph nodes When he does an abdominal crunch I can feel a small area of tenderness deep in his abdominal area No defect was noted  Ultrasound of right inguinal region Along the right inguinal ligament in the midportion there is a small soft tissue disruption with hypoechoic change that measures 1 cm wide No other abnormalities are noted  Impression is small fascial tear at the insertion of abdominal muscles to inguinal ligament  Ultrasound and interpretation by Wolfgang Phoenix. Oneida Alar, MD

## 2020-01-31 NOTE — Assessment & Plan Note (Signed)
I reassured him that I found no evidence of a true hernia I think he is at low risk of this developing a larger tear I suggested taking time Avoiding anything that causes significant pain in this area and specifically no declines sit ups Do some gentle short arc abdominal strengthening I would expect this to heal in 4 to 6 weeks

## 2020-03-04 DIAGNOSIS — Z1211 Encounter for screening for malignant neoplasm of colon: Secondary | ICD-10-CM | POA: Diagnosis not present

## 2020-03-04 DIAGNOSIS — K635 Polyp of colon: Secondary | ICD-10-CM | POA: Diagnosis not present

## 2020-03-04 DIAGNOSIS — Z86010 Personal history of colonic polyps: Secondary | ICD-10-CM | POA: Diagnosis not present

## 2020-03-04 DIAGNOSIS — K648 Other hemorrhoids: Secondary | ICD-10-CM | POA: Diagnosis not present

## 2020-03-10 ENCOUNTER — Other Ambulatory Visit (HOSPITAL_COMMUNITY): Payer: Self-pay | Admitting: Orthopedic Surgery

## 2020-03-10 ENCOUNTER — Ambulatory Visit (HOSPITAL_COMMUNITY)
Admission: RE | Admit: 2020-03-10 | Discharge: 2020-03-10 | Disposition: A | Discharge status: Home / Self Care | Payer: BC Managed Care – PPO | Source: Ambulatory Visit | Attending: Internal Medicine | Admitting: Internal Medicine

## 2020-03-10 ENCOUNTER — Other Ambulatory Visit: Payer: Self-pay

## 2020-03-10 ENCOUNTER — Encounter (HOSPITAL_COMMUNITY): Payer: BC Managed Care – PPO

## 2020-03-10 DIAGNOSIS — M79604 Pain in right leg: Secondary | ICD-10-CM

## 2020-03-10 NOTE — Progress Notes (Signed)
VASCULAR LAB    Right lower extremity venous duplex has been performed.  See CV proc for preliminary results.  Called report to Dr. Osvaldo Shipper, [facility], RVT 03/10/2020, 3:42 PM

## 2020-03-11 DIAGNOSIS — E782 Mixed hyperlipidemia: Secondary | ICD-10-CM | POA: Diagnosis not present

## 2020-03-11 DIAGNOSIS — I82409 Acute embolism and thrombosis of unspecified deep veins of unspecified lower extremity: Secondary | ICD-10-CM | POA: Diagnosis not present

## 2020-03-11 DIAGNOSIS — Z0000 Encounter for general adult medical examination without abnormal findings: Secondary | ICD-10-CM | POA: Diagnosis not present

## 2020-03-11 DIAGNOSIS — R030 Elevated blood-pressure reading, without diagnosis of hypertension: Secondary | ICD-10-CM | POA: Diagnosis not present

## 2020-03-11 DIAGNOSIS — E559 Vitamin D deficiency, unspecified: Secondary | ICD-10-CM | POA: Diagnosis not present

## 2020-08-28 IMAGING — CT CT ANGIO CHEST
2 of 6 series · 13 of 36 positions shown · IV contrast (iopamidol)
Comparison: Prior study is unavailable for comparison

CLINICAL DATA: 4.3 cm thoracic aorta aneurysm seen on cardiac CT

EXAM:
CT ANGIOGRAPHY CHEST WITH CONTRAST
TECHNIQUE: Multidetector CT imaging of the chest was performed using the
standard protocol during bolus administration of intravenous
contrast. Multiplanar CT image reconstructions and MIPs were
obtained to evaluate the vascular anatomy.
CONTRAST:  75mL EPB3WV-UI4 IOPAMIDOL (EPB3WV-UI4) INJECTION 76%
Creatinine was obtained on site at [HOSPITAL] at [REDACTED].
Results: Creatinine 1 mg/dL.

[Series 5: cta thorax 2.00 bv36 s3 axial arterial · axial · arterial · 0.56mm/px · z∈[+1574,+1816]mm · 12 of 144 slices shown]
[im 12/144  lung]
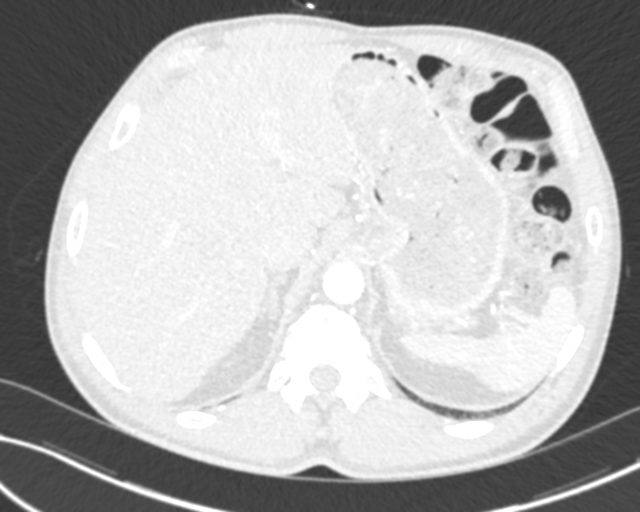
[im 23/144  mediastinal]
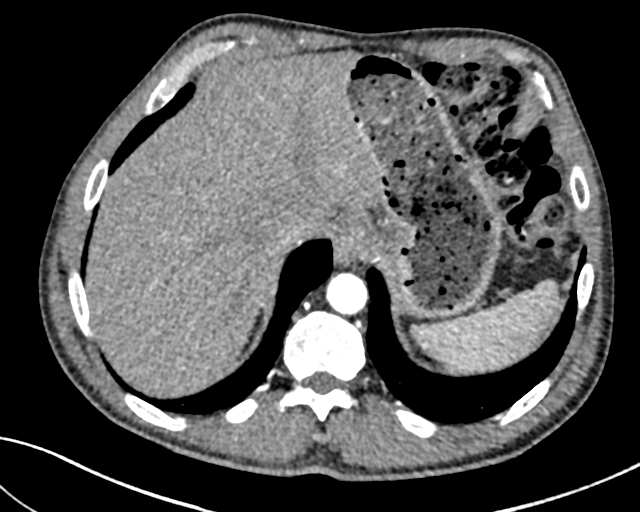
[im 34/144  lung]
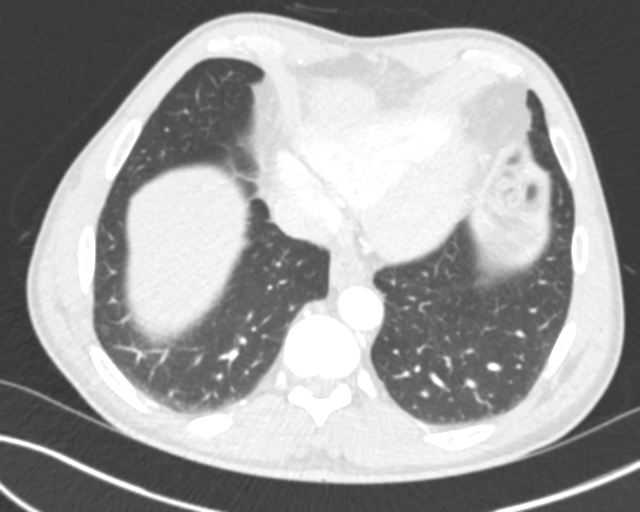
[im 45/144  mediastinal]
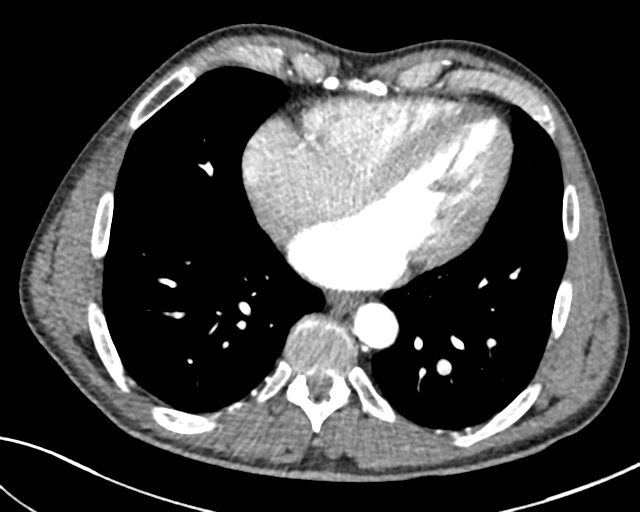
[im 56/144  lung]
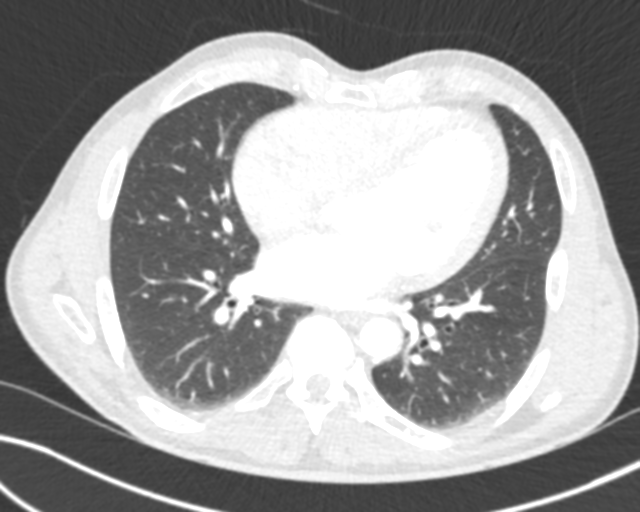
[im 67/144  mediastinal]
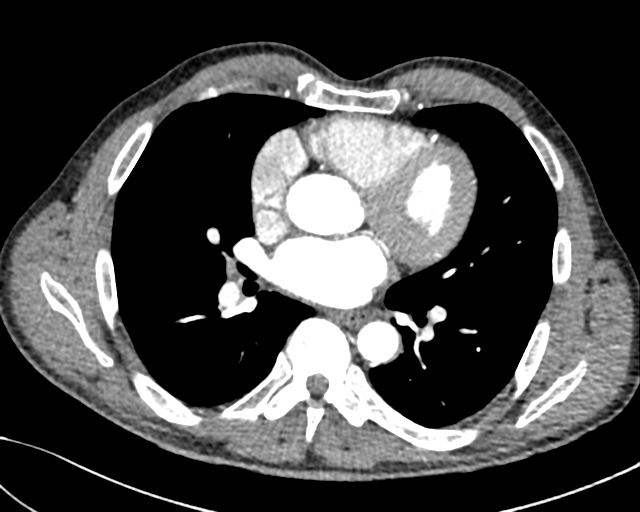
[im 78/144  lung]
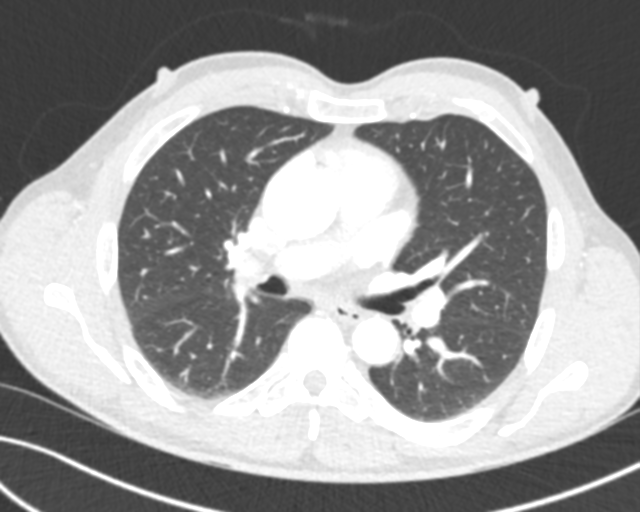
[im 89/144  mediastinal]
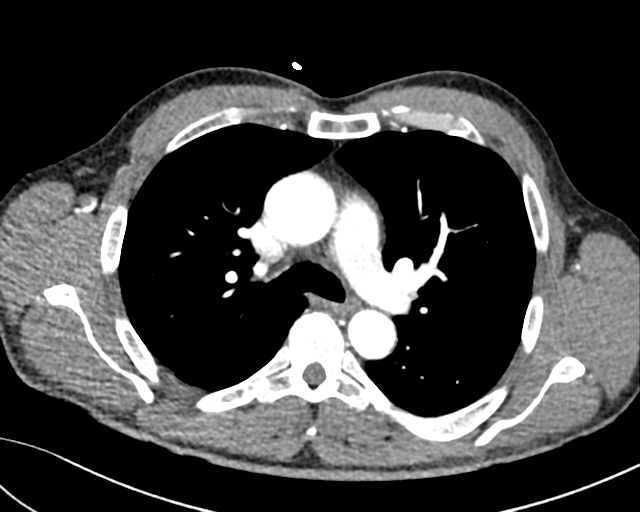
[im 100/144  lung]
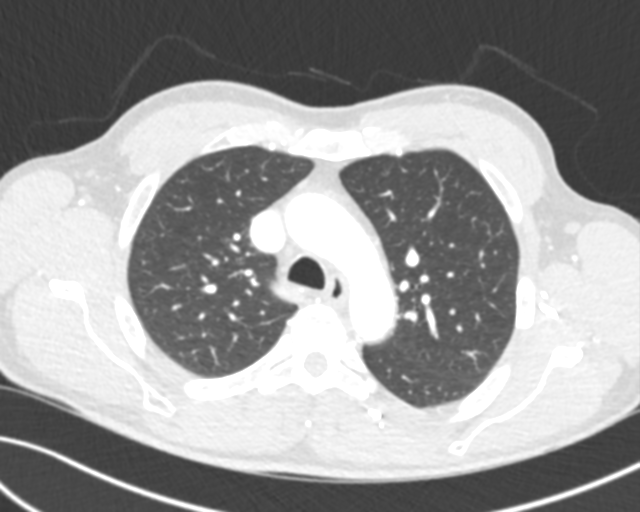
[im 111/144  mediastinal]
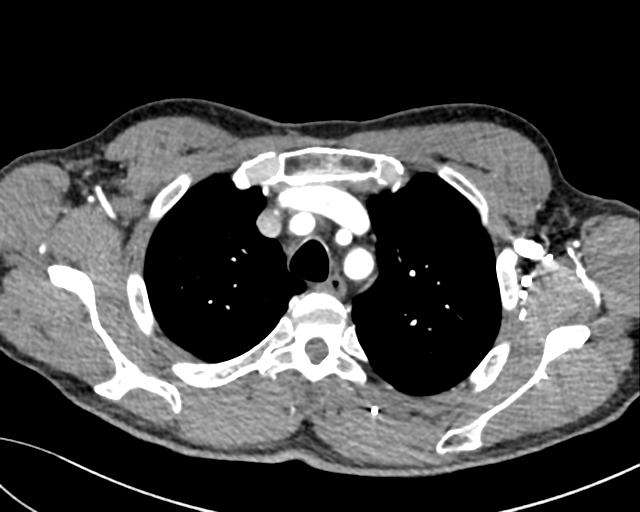
[im 122/144  lung]
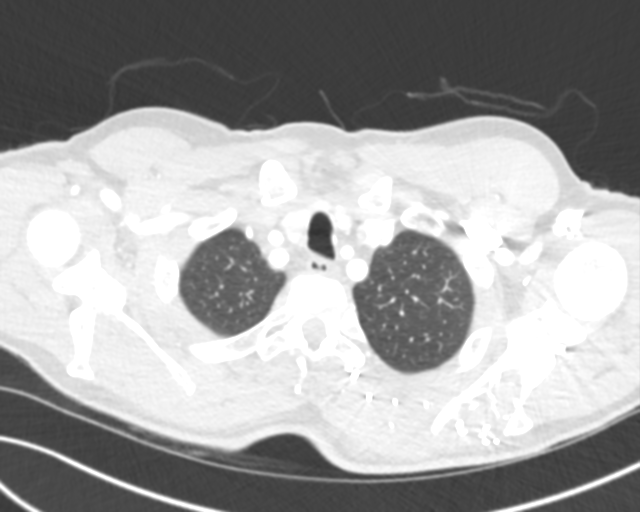
[im 133/144  mediastinal]
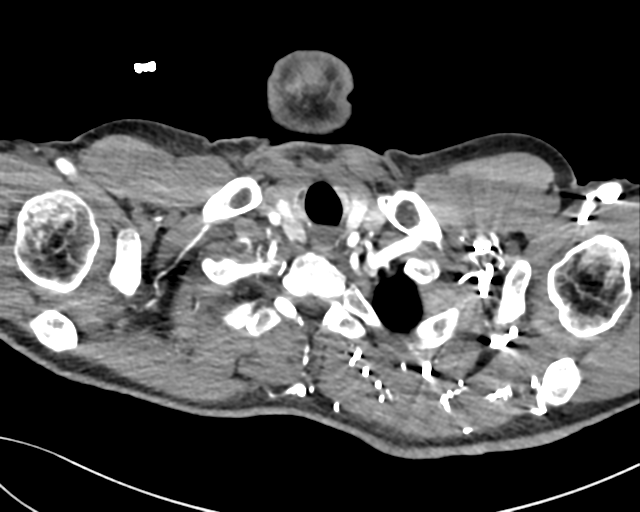

[Series 10: cta thorax 2.00 bv36 s3 cor st · coronal · 0.56mm/px · 1 of 144 slices shown]
[im 72/144  mediastinal]
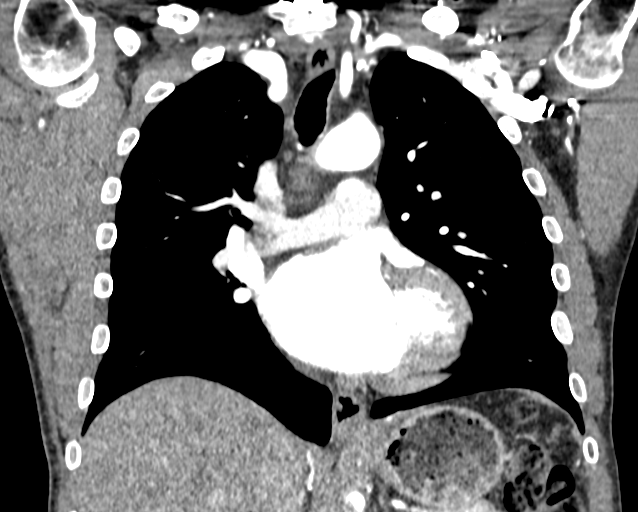

[13 of 36 positions shown; findings below may reference images not displayed]

FINDINGS: Cardiovascular: Ascending thoracic aorta measures up to 3.9 cm.
There is no aneurysm identified. Normal heart size. No pericardial
effusion.

Mediastinum/Nodes: There are no enlarged lymph nodes. Thyroid and
esophagus are unremarkable.

Lungs/Pleura: No consolidation or mass.  No pleural effusion.

Upper Abdomen: No acute abnormality.

Musculoskeletal: No significant abnormality.

Review of the MIP images confirms the above findings.
IMPRESSION: No aneurysm identified.

## 2020-10-23 DIAGNOSIS — H0102A Squamous blepharitis right eye, upper and lower eyelids: Secondary | ICD-10-CM | POA: Diagnosis not present

## 2020-10-23 DIAGNOSIS — H0102B Squamous blepharitis left eye, upper and lower eyelids: Secondary | ICD-10-CM | POA: Diagnosis not present

## 2020-10-23 DIAGNOSIS — H16002 Unspecified corneal ulcer, left eye: Secondary | ICD-10-CM | POA: Diagnosis not present

## 2020-10-24 DIAGNOSIS — H0102B Squamous blepharitis left eye, upper and lower eyelids: Secondary | ICD-10-CM | POA: Diagnosis not present

## 2020-10-24 DIAGNOSIS — H0102A Squamous blepharitis right eye, upper and lower eyelids: Secondary | ICD-10-CM | POA: Diagnosis not present

## 2020-10-24 DIAGNOSIS — H16002 Unspecified corneal ulcer, left eye: Secondary | ICD-10-CM | POA: Diagnosis not present

## 2021-01-01 DIAGNOSIS — M25511 Pain in right shoulder: Secondary | ICD-10-CM | POA: Diagnosis not present

## 2021-01-01 DIAGNOSIS — M25572 Pain in left ankle and joints of left foot: Secondary | ICD-10-CM | POA: Diagnosis not present

## 2021-01-20 DIAGNOSIS — M25571 Pain in right ankle and joints of right foot: Secondary | ICD-10-CM | POA: Diagnosis not present

## 2021-01-20 DIAGNOSIS — M79671 Pain in right foot: Secondary | ICD-10-CM | POA: Diagnosis not present

## 2021-01-20 DIAGNOSIS — R269 Unspecified abnormalities of gait and mobility: Secondary | ICD-10-CM | POA: Diagnosis not present

## 2021-01-22 DIAGNOSIS — R269 Unspecified abnormalities of gait and mobility: Secondary | ICD-10-CM | POA: Diagnosis not present

## 2021-01-22 DIAGNOSIS — M25571 Pain in right ankle and joints of right foot: Secondary | ICD-10-CM | POA: Diagnosis not present

## 2021-01-22 DIAGNOSIS — M79671 Pain in right foot: Secondary | ICD-10-CM | POA: Diagnosis not present

## 2021-03-23 DIAGNOSIS — Z20822 Contact with and (suspected) exposure to covid-19: Secondary | ICD-10-CM | POA: Diagnosis not present

## 2021-04-01 DIAGNOSIS — Z86718 Personal history of other venous thrombosis and embolism: Secondary | ICD-10-CM | POA: Diagnosis not present

## 2021-04-01 DIAGNOSIS — Z0000 Encounter for general adult medical examination without abnormal findings: Secondary | ICD-10-CM | POA: Diagnosis not present

## 2021-04-01 DIAGNOSIS — E782 Mixed hyperlipidemia: Secondary | ICD-10-CM | POA: Diagnosis not present

## 2021-04-01 DIAGNOSIS — Z8709 Personal history of other diseases of the respiratory system: Secondary | ICD-10-CM | POA: Diagnosis not present

## 2021-04-01 DIAGNOSIS — Z125 Encounter for screening for malignant neoplasm of prostate: Secondary | ICD-10-CM | POA: Diagnosis not present

## 2021-04-01 DIAGNOSIS — E559 Vitamin D deficiency, unspecified: Secondary | ICD-10-CM | POA: Diagnosis not present

## 2021-05-08 ENCOUNTER — Ambulatory Visit (INDEPENDENT_AMBULATORY_CARE_PROVIDER_SITE_OTHER): Payer: BC Managed Care – PPO | Admitting: Ophthalmology

## 2021-05-08 ENCOUNTER — Encounter (INDEPENDENT_AMBULATORY_CARE_PROVIDER_SITE_OTHER): Payer: Self-pay | Admitting: Ophthalmology

## 2021-05-08 ENCOUNTER — Other Ambulatory Visit: Payer: Self-pay

## 2021-05-08 DIAGNOSIS — I10 Essential (primary) hypertension: Secondary | ICD-10-CM

## 2021-05-08 DIAGNOSIS — H43811 Vitreous degeneration, right eye: Secondary | ICD-10-CM

## 2021-05-08 DIAGNOSIS — H0102B Squamous blepharitis left eye, upper and lower eyelids: Secondary | ICD-10-CM | POA: Diagnosis not present

## 2021-05-08 DIAGNOSIS — H25813 Combined forms of age-related cataract, bilateral: Secondary | ICD-10-CM | POA: Diagnosis not present

## 2021-05-08 DIAGNOSIS — H35033 Hypertensive retinopathy, bilateral: Secondary | ICD-10-CM | POA: Diagnosis not present

## 2021-05-08 DIAGNOSIS — H0102A Squamous blepharitis right eye, upper and lower eyelids: Secondary | ICD-10-CM | POA: Diagnosis not present

## 2021-05-08 NOTE — Progress Notes (Signed)
[facility] Note  05/08/2021     CHIEF COMPLAINT Patient presents for Retina Evaluation   HISTORY OF PRESENT ILLNESS: Kyle Reese is a 66 y.o. male who presents to the clinic today for:   HPI     Retina Evaluation   In right eye.  This started 3 days ago.  Associated Symptoms Flashes and Floaters.  I, the attending physician,  performed the HPI with the patient and updated documentation appropriately.        Comments   Patient was just seen by Dr. Shirleen Schirmer who thinks patient has a vitreous detachment OD-  Increase in floaters and FOLs OD right side x 3 days ago. Vision appears stable.  Pt wears a +3.75 LCL       Last edited by Bernarda Caffey, MD on 05/10/2021  3:24 PM.    Pt states about 3 days ago, he started seeing what he thought was a hair in his vision, but then he started seeing fol, he got worried that he might have a RD, so he went to see Dr. Katy Fitch who has previously managed a corneal abrasion for him, they dilated him and said that he has a PVD, but they don't think he has any tears, pt wanted to be sure he had no tears, so he decided to come here and get checked again  Referring physician: Shirleen Schirmer [address]  [place], [place] [id number]  HISTORICAL INFORMATION:   Selected notes from the MEDICAL RECORD NUMBER Referred by Shirleen Schirmer, PA-C for concern of PVD OD LEE: 01.06.23 Ocular Hx- PVD OD    CURRENT MEDICATIONS: No current outpatient medications on file. (Ophthalmic Drugs)   No current facility-administered medications for this visit. (Ophthalmic Drugs)   Current Outpatient Medications (Other)  Medication Sig   aspirin 81 MG tablet Take 81 mg by mouth daily.     Coenzyme Q10 (COQ-10 PO) Take 1 capsule by mouth daily.   ezetimibe (ZETIA) 10 MG tablet Take 5 mg by mouth daily.   fish oil-omega-3 fatty acids 1000 MG capsule Take 2 g by mouth daily.     NIACIN PO Take 1 tablet by mouth daily.    valsartan (DIOVAN) 80 MG tablet Take 80 mg by mouth daily.   No current facility-administered medications for this visit. (Other)   REVIEW OF SYSTEMS: ROS   Positive for: Musculoskeletal Negative for: Constitutional, Gastrointestinal, Neurological, Skin, Genitourinary, HENT, Endocrine, Cardiovascular, Eyes, Respiratory, Psychiatric, Allergic/Imm, Heme/Lymph Last edited by Leonie Douglas, COA on 05/08/2021 10:47 AM.     ALLERGIES No Known Allergies  PAST MEDICAL HISTORY Past Medical History:  Diagnosis Date   Closed fracture of left olecranon process 11/23/2014   Colon polyps    Dysphagia    Hemorrhoids    Nondisplaced fracture of lateral end of left clavicle 11/23/2014   Past Surgical History:  Procedure Laterality Date   disc implant     ESOPHAGOGASTRODUODENOSCOPY  04/22/2011   Procedure: ESOPHAGOGASTRODUODENOSCOPY (EGD);  Surgeon: Garlan Fair, MD;  Location: [facility];  Service: Endoscopy;  Laterality: N/A;   ORIF ULNAR FRACTURE Left 11/24/2014   Procedure: OPEN REDUCTION INTERNAL FIXATION (ORIF) OLECRANON;  Surgeon: Marchia Bond, MD;  Location: [facility];  Service: Orthopedics;  Laterality: Left;   WRIST FRACTURE SURGERY  1990    FAMILY HISTORY History reviewed. No pertinent family history.  SOCIAL HISTORY Social History   Tobacco Use   Smoking status: Never   Smokeless tobacco: Never  Substance  Use Topics   Alcohol use: Yes   Drug use: No       OPHTHALMIC EXAM:  Base Eye Exam     Visual Acuity (Snellen - Linear)       Right Left   Dist [place] 20/25 +2 20/100 +2   Dist ph [place]  20/60 +2         Tonometry (Tonopen, 10:42 AM)       Right Left   Pressure 20 18         Pupils       Dark Light Shape React   Right 7 7 Round Dilated   Left 7 7 Round Dilated         Visual Fields (Counting fingers)       Left Right    Full Full         Extraocular Movement       Right Left    Full Full         Neuro/Psych     Oriented x3: Yes    Mood/Affect: Normal         Dilation     Both eyes:   Very dilated, came straight over from Dr. Melvenia Needles office          Slit Lamp and Fundus Exam     Slit Lamp Exam       Right Left   Lids/Lashes Dermatochalasis - upper lid Dermatochalasis - upper lid   Conjunctiva/Sclera White and quiet White and quiet   Cornea Clear Clear   Anterior Chamber Deep and quiet Deep and quiet   Iris Round and dilated Round and dilated   Lens 2+ Nuclear sclerosis, 2+ Cortical cataract 2+ Nuclear sclerosis, 2+ Cortical cataract   Anterior Vitreous Vitreous syneresis, no pigment, low lying Posterior vitreous detachment, Weiss ring Vitreous syneresis, no pigment         Fundus Exam       Right Left   Disc Pink and Sharp, temporal PPA Pink and Sharp, temporal PPA   C/D Ratio 0.1 0.1   Macula Flat, Good foveal reflex, No heme or edema Flat, Good foveal reflex, No heme or edema   Vessels mild attenuation, mild tortuousity mild attenuation, mild Copper wiring   Periphery Attached, No RT/RD on 360 scleral depression Attached           Refraction     Manifest Refraction       Sphere Cylinder Dist [place]   Right      Left +3.50 Sphere 20/25            IMAGING AND PROCEDURES  Imaging and Procedures for 05/08/2021  OCT, Retina - OU - Both Eyes       Right Eye Quality was good. Central Foveal Thickness: 300. Progression has no prior data. Findings include normal foveal contour, no IRF, no SRF, myopic contour (Trace vitreous opacities).   Left Eye Quality was good. Central Foveal Thickness: 295. Progression has no prior data. Findings include normal foveal contour, no IRF, no SRF, vitreomacular adhesion .   Notes *Images captured and stored on drive  Diagnosis / Impression:  NFP; no IRF/SRF OU OD: tr vitreous opacities   Clinical management:  See below  Abbreviations: NFP - Normal foveal profile. CME - cystoid macular edema. PED - pigment epithelial detachment. IRF -  intraretinal fluid. SRF - subretinal fluid. EZ - ellipsoid zone. ERM - epiretinal membrane. ORA - outer retinal atrophy. ORT - outer retinal tubulation. SRHM -  subretinal hyper-reflective material. IRHM - intraretinal hyper-reflective material            ASSESSMENT/PLAN:    ICD-10-CM   1. Posterior vitreous detachment of right eye  H43.811 OCT, Retina - OU - Both Eyes    2. Essential hypertension  I10     3. Hypertensive retinopathy of both eyes  H35.033     4. Combined forms of age-related cataract of both eyes  H25.813      PVD / vitreous syneresis OD  - acute onset 1.3.23 w/ symptomatic flashes/floaters  - flashes have ceased, floaters persist  - Discussed findings and prognosis  - No RT or RD on 360 scleral depressed exam  - Reviewed s/s of RT/RD  - Strict return precautions for any such RT/RD signs/symptoms  - f/u in 4-6 wks -- DFE/OCT  2,3. Hypertensive retinopathy OU - discussed importance of tight BP control - monitor  4. Mixed Cataract OU - The symptoms of cataract, surgical options, and treatments and risks were discussed with patient. - discussed diagnosis and progression - not yet visually significant - monitor   Ophthalmic Meds Ordered this visit:  No orders of the defined types were placed in this encounter.    Return for f/u 4-6 weeks, PVD OD.  There are no Patient Instructions on file for this visit.   Explained the diagnoses, plan, and follow up with the patient and they expressed understanding.  Patient expressed understanding of the importance of proper follow up care.   Gardiner Sleeper, M.D., Ph.D. Diseases & Surgery of the Retina and [place] 05/08/2021  I have reviewed the above documentation for accuracy and completeness, and I agree with the above. Gardiner Sleeper, M.D., Ph.D. 05/10/21 3:27 PM   Abbreviations: M myopia (nearsighted); A astigmatism; H hyperopia (farsighted); P presbyopia; Mrx spectacle  prescription;  CTL contact lenses; OD right eye; OS left eye; OU both eyes  XT exotropia; ET esotropia; PEK punctate epithelial keratitis; PEE punctate epithelial erosions; DES dry eye syndrome; MGD meibomian gland dysfunction; ATs artificial tears; PFAT's preservative free artificial tears; [place] nuclear sclerotic cataract; PSC posterior subcapsular cataract; ERM epi-retinal membrane; PVD posterior vitreous detachment; RD retinal detachment; DM diabetes mellitus; DR diabetic retinopathy; NPDR non-proliferative diabetic retinopathy; PDR proliferative diabetic retinopathy; CSME clinically significant macular edema; DME diabetic macular edema; dbh dot blot hemorrhages; CWS cotton wool spot; POAG primary open angle glaucoma; C/D cup-to-disc ratio; HVF humphrey visual field; GVF goldmann visual field; OCT optical coherence tomography; IOP intraocular pressure; BRVO Branch retinal vein occlusion; CRVO central retinal vein occlusion; CRAO central retinal artery occlusion; BRAO branch retinal artery occlusion; RT retinal tear; SB scleral buckle; PPV pars plana vitrectomy; VH Vitreous hemorrhage; PRP panretinal laser photocoagulation; IVK intravitreal kenalog; VMT vitreomacular traction; MH Macular hole;  NVD neovascularization of the disc; NVE neovascularization elsewhere; AREDS age related eye disease study; ARMD age related macular degeneration; POAG primary open angle glaucoma; EBMD epithelial/anterior basement membrane dystrophy; ACIOL anterior chamber intraocular lens; IOL intraocular lens; PCIOL posterior chamber intraocular lens; Phaco/IOL phacoemulsification with intraocular lens placement; [place] photorefractive keratectomy; LASIK laser assisted in situ keratomileusis; HTN hypertension; DM diabetes mellitus; COPD chronic obstructive pulmonary disease

## 2021-05-10 ENCOUNTER — Encounter (INDEPENDENT_AMBULATORY_CARE_PROVIDER_SITE_OTHER): Payer: Self-pay | Admitting: Ophthalmology

## 2021-06-11 NOTE — Progress Notes (Signed)
[facility] Note  06/19/2021     CHIEF COMPLAINT Patient presents for Retina Follow Up   HISTORY OF PRESENT ILLNESS: Kyle Reese is a 66 y.o. male who presents to the clinic today for:   HPI     Retina Follow Up   Patient presents with  PVD.  In right eye.  Duration of 6 weeks.  Since onset it is gradually improving.  I, the attending physician,  performed the HPI with the patient and updated documentation appropriately.        Comments   6 week follow up PVD OD- The multiple floaters and FOLs has improved.  Vision seems weird and off.  Harder to read on a white background.  When driving at night he is noticing more glare from headlights.  He states this could have been happening before the last visit but hasn't been aware of it until now.       Last edited by Bernarda Caffey, MD on 06/19/2021 12:27 PM.     Hard for pt to focus.  Looks like a lens moving in and out when trying to see something on a white background.  Appears to be more OD than OS.   Referring physician: Shirleen Schirmer, PA-C   HISTORICAL INFORMATION:   Selected notes from the MEDICAL RECORD NUMBER Referred by Shirleen Schirmer, PA-C for concern of PVD OD LEE: 01.06.23 Ocular Hx- PVD OD    CURRENT MEDICATIONS: No current outpatient medications on file. (Ophthalmic Drugs)   No current facility-administered medications for this visit. (Ophthalmic Drugs)   Current Outpatient Medications (Other)  Medication Sig   aspirin 81 MG tablet Take 81 mg by mouth daily.     Coenzyme Q10 (COQ-10 PO) Take 1 capsule by mouth daily.   ezetimibe (ZETIA) 10 MG tablet Take 5 mg by mouth daily.   fish oil-omega-3 fatty acids 1000 MG capsule Take 2 g by mouth daily.     NIACIN PO Take 1 tablet by mouth daily.   valsartan (DIOVAN) 80 MG tablet Take 80 mg by mouth daily.   No current facility-administered medications for this visit. (Other)   REVIEW OF SYSTEMS: ROS   Positive for:  Musculoskeletal Negative for: Constitutional, Gastrointestinal, Neurological, Skin, Genitourinary, HENT, Endocrine, Cardiovascular, Eyes, Respiratory, Psychiatric, Allergic/Imm, Heme/Lymph Last edited by Leonie Douglas, COA on 06/19/2021  8:57 AM.     ALLERGIES No Known Allergies  PAST MEDICAL HISTORY Past Medical History:  Diagnosis Date   Closed fracture of left olecranon process 11/23/2014   Colon polyps    Dysphagia    Hemorrhoids    Nondisplaced fracture of lateral end of left clavicle 11/23/2014   Past Surgical History:  Procedure Laterality Date   disc implant     ESOPHAGOGASTRODUODENOSCOPY  04/22/2011   Procedure: ESOPHAGOGASTRODUODENOSCOPY (EGD);  Surgeon: Garlan Fair, MD;  Location: [facility];  Service: Endoscopy;  Laterality: N/A;   ORIF ULNAR FRACTURE Left 11/24/2014   Procedure: OPEN REDUCTION INTERNAL FIXATION (ORIF) OLECRANON;  Surgeon: Marchia Bond, MD;  Location: [facility];  Service: Orthopedics;  Laterality: Left;   WRIST FRACTURE SURGERY  1990    FAMILY HISTORY History reviewed. No pertinent family history.  SOCIAL HISTORY Social History   Tobacco Use   Smoking status: Never   Smokeless tobacco: Never  Substance Use Topics   Alcohol use: Yes   Drug use: No       OPHTHALMIC EXAM:  Base Eye Exam     Visual  Acuity (Snellen - Linear)       Right Left   Dist [place] 20/20 -2 20/30 +2   Dist ph [place]  20/25 -2    Correction: Contacts  LCL only        Tonometry (Tonopen, 8:52 AM)       Right Left   Pressure 15 12         Pupils       Dark Light Shape React APD   Right 3 2 Round Brisk None   Left 3 2 Round Brisk None         Visual Fields (Counting fingers)       Left Right    Full Full         Neuro/Psych     Oriented x3: Yes   Mood/Affect: Normal         Dilation     Both eyes: 1.0% Mydriacyl, 2.5% Phenylephrine @ 8:52 AM           Slit Lamp and Fundus Exam     Slit Lamp Exam       Right Left   Lids/Lashes  Dermatochalasis - upper lid Dermatochalasis - upper lid   Conjunctiva/Sclera White and quiet White and quiet   Cornea Clear Clear   Anterior Chamber Deep and quiet Deep and quiet   Iris Round and dilated Round and dilated   Lens 2+ Nuclear sclerosis, 2+ Cortical cataract 2+ Nuclear sclerosis, 2+ Cortical cataract   Anterior Vitreous Vitreous syneresis, no pigment, PVD with mild vitreous condensations Vitreous syneresis, no pigment         Fundus Exam       Right Left   Disc Pink and Sharp, temporal PPA Pink and Sharp, temporal PPA   C/D Ratio 0.1 0.1   Macula Flat, Good foveal reflex, No heme or edema Flat, Good foveal reflex, No heme or edema   Vessels mild attenuation, mild tortuousity mild attenuation, mild Copper wiring   Periphery Attached, No RT/RD Attached, No heme            IMAGING AND PROCEDURES  Imaging and Procedures for 06/19/2021  OCT, Retina - OU - Both Eyes       Right Eye Quality was good. Central Foveal Thickness: 273. Progression has been stable. Findings include normal foveal contour, no IRF, no SRF, myopic contour (Trace vitreous opacities).   Left Eye Quality was good. Central Foveal Thickness: 287. Progression has been stable. Findings include normal foveal contour, no IRF, no SRF, vitreomacular adhesion , myopic contour.   Notes *Images captured and stored on drive  Diagnosis / Impression:  NFP; no IRF/SRF OU OD: tr vitreous opacities  Clinical management:  See below  Abbreviations: NFP - Normal foveal profile. CME - cystoid macular edema. PED - pigment epithelial detachment. IRF - intraretinal fluid. SRF - subretinal fluid. EZ - ellipsoid zone. ERM - epiretinal membrane. ORA - outer retinal atrophy. ORT - outer retinal tubulation. SRHM - subretinal hyper-reflective material. IRHM - intraretinal hyper-reflective material            ASSESSMENT/PLAN:    ICD-10-CM   1. Posterior vitreous detachment of right eye  H43.811 OCT, Retina - OU -  Both Eyes    2. Essential hypertension  I10     3. Hypertensive retinopathy of both eyes  H35.033     4. Combined forms of age-related cataract of both eyes  H25.813       PVD / vitreous syneresis OD  -  acute onset 01.03.23 w/ symptomatic flashes/floaters  - flashes have ceased, floaters improved  - Discussed findings and prognosis  - No RT or RD on 360 scleral depressed exam  - Reviewed s/s of RT/RD  - Strict return precautions for any such RT/RD signs/symptoms - pt is cleared from a retina standpoint for release to [facility] and resumption of primary eye care   2,3. Hypertensive retinopathy OU - discussed importance of tight BP control - monitor   4. Mixed Cataract OU - The symptoms of cataract, surgical options, and treatments and risks were discussed with patient. - discussed diagnosis and progression - not yet visually significant - monitor   Ophthalmic Meds Ordered this visit:  No orders of the defined types were placed in this encounter.    Return if symptoms worsen or fail to improve.  There are no Patient Instructions on file for this visit.   Explained the diagnoses, plan, and follow up with the patient and they expressed understanding.  Patient expressed understanding of the importance of proper follow up care.   This document serves as a record of services personally performed by Gardiner Sleeper, MD, PhD. It was created on their behalf by Leonie Douglas, an ophthalmic technician. The creation of this record is the provider's dictation and/or activities during the visit.    Electronically signed by: Leonie Douglas COA, 06/19/21  12:33 PM  Gardiner Sleeper, M.D., Ph.D. Diseases & Surgery of the Retina and Vitreous [facility]  I have reviewed the above documentation for accuracy and completeness, and I agree with the above. Gardiner Sleeper, M.D., Ph.D. 06/19/21 12:33 PM  Abbreviations: M myopia (nearsighted); A astigmatism; H  hyperopia (farsighted); P presbyopia; Mrx spectacle prescription;  CTL contact lenses; OD right eye; OS left eye; OU both eyes  XT exotropia; ET esotropia; PEK punctate epithelial keratitis; PEE punctate epithelial erosions; DES dry eye syndrome; MGD meibomian gland dysfunction; ATs artificial tears; PFAT's preservative free artificial tears; [place] nuclear sclerotic cataract; PSC posterior subcapsular cataract; ERM epi-retinal membrane; PVD posterior vitreous detachment; RD retinal detachment; DM diabetes mellitus; DR diabetic retinopathy; NPDR non-proliferative diabetic retinopathy; PDR proliferative diabetic retinopathy; CSME clinically significant macular edema; DME diabetic macular edema; dbh dot blot hemorrhages; CWS cotton wool spot; POAG primary open angle glaucoma; C/D cup-to-disc ratio; HVF humphrey visual field; GVF goldmann visual field; OCT optical coherence tomography; IOP intraocular pressure; BRVO Branch retinal vein occlusion; CRVO central retinal vein occlusion; CRAO central retinal artery occlusion; BRAO branch retinal artery occlusion; RT retinal tear; SB scleral buckle; PPV pars plana vitrectomy; VH Vitreous hemorrhage; PRP panretinal laser photocoagulation; IVK intravitreal kenalog; VMT vitreomacular traction; MH Macular hole;  NVD neovascularization of the disc; NVE neovascularization elsewhere; AREDS age related eye disease study; ARMD age related macular degeneration; POAG primary open angle glaucoma; EBMD epithelial/anterior basement membrane dystrophy; ACIOL anterior chamber intraocular lens; IOL intraocular lens; PCIOL posterior chamber intraocular lens; Phaco/IOL phacoemulsification with intraocular lens placement; [place] photorefractive keratectomy; LASIK laser assisted in situ keratomileusis; HTN hypertension; DM diabetes mellitus; COPD chronic obstructive pulmonary disease

## 2021-06-19 ENCOUNTER — Other Ambulatory Visit: Payer: Self-pay

## 2021-06-19 ENCOUNTER — Encounter (INDEPENDENT_AMBULATORY_CARE_PROVIDER_SITE_OTHER): Payer: Self-pay | Admitting: Ophthalmology

## 2021-06-19 ENCOUNTER — Ambulatory Visit (INDEPENDENT_AMBULATORY_CARE_PROVIDER_SITE_OTHER): Payer: BC Managed Care – PPO | Admitting: Ophthalmology

## 2021-06-19 DIAGNOSIS — H43811 Vitreous degeneration, right eye: Secondary | ICD-10-CM | POA: Diagnosis not present

## 2021-06-19 DIAGNOSIS — H35033 Hypertensive retinopathy, bilateral: Secondary | ICD-10-CM | POA: Diagnosis not present

## 2021-06-19 DIAGNOSIS — H25813 Combined forms of age-related cataract, bilateral: Secondary | ICD-10-CM

## 2021-06-19 DIAGNOSIS — I10 Essential (primary) hypertension: Secondary | ICD-10-CM | POA: Diagnosis not present

## 2022-01-08 DIAGNOSIS — M25551 Pain in right hip: Secondary | ICD-10-CM | POA: Diagnosis not present

## 2022-01-08 DIAGNOSIS — M25511 Pain in right shoulder: Secondary | ICD-10-CM | POA: Diagnosis not present

## 2022-01-10 ENCOUNTER — Other Ambulatory Visit: Payer: Self-pay

## 2022-01-10 ENCOUNTER — Emergency Department (HOSPITAL_BASED_OUTPATIENT_CLINIC_OR_DEPARTMENT_OTHER): Payer: BC Managed Care – PPO

## 2022-01-10 ENCOUNTER — Encounter (HOSPITAL_BASED_OUTPATIENT_CLINIC_OR_DEPARTMENT_OTHER): Payer: Self-pay | Admitting: Emergency Medicine

## 2022-01-10 ENCOUNTER — Inpatient Hospital Stay (HOSPITAL_BASED_OUTPATIENT_CLINIC_OR_DEPARTMENT_OTHER)
Admission: EM | Admit: 2022-01-10 | Discharge: 2022-01-12 | DRG: 175 | Disposition: A | Discharge status: Home / Self Care | Payer: BC Managed Care – PPO | Attending: Internal Medicine | Admitting: Internal Medicine

## 2022-01-10 DIAGNOSIS — Z7982 Long term (current) use of aspirin: Secondary | ICD-10-CM | POA: Diagnosis not present

## 2022-01-10 DIAGNOSIS — I82409 Acute embolism and thrombosis of unspecified deep veins of unspecified lower extremity: Secondary | ICD-10-CM

## 2022-01-10 DIAGNOSIS — I82412 Acute embolism and thrombosis of left femoral vein: Secondary | ICD-10-CM | POA: Diagnosis not present

## 2022-01-10 DIAGNOSIS — R0602 Shortness of breath: Secondary | ICD-10-CM | POA: Diagnosis not present

## 2022-01-10 DIAGNOSIS — I2609 Other pulmonary embolism with acute cor pulmonale: Secondary | ICD-10-CM | POA: Diagnosis not present

## 2022-01-10 DIAGNOSIS — R0609 Other forms of dyspnea: Secondary | ICD-10-CM | POA: Diagnosis not present

## 2022-01-10 DIAGNOSIS — I82432 Acute embolism and thrombosis of left popliteal vein: Secondary | ICD-10-CM | POA: Diagnosis not present

## 2022-01-10 DIAGNOSIS — Z86718 Personal history of other venous thrombosis and embolism: Secondary | ICD-10-CM

## 2022-01-10 DIAGNOSIS — I10 Essential (primary) hypertension: Secondary | ICD-10-CM | POA: Diagnosis not present

## 2022-01-10 DIAGNOSIS — Z79899 Other long term (current) drug therapy: Secondary | ICD-10-CM | POA: Diagnosis not present

## 2022-01-10 DIAGNOSIS — I7121 Aneurysm of the ascending aorta, without rupture: Secondary | ICD-10-CM | POA: Diagnosis not present

## 2022-01-10 DIAGNOSIS — Z20822 Contact with and (suspected) exposure to covid-19: Secondary | ICD-10-CM | POA: Diagnosis present

## 2022-01-10 DIAGNOSIS — R9431 Abnormal electrocardiogram [ECG] [EKG]: Secondary | ICD-10-CM | POA: Diagnosis not present

## 2022-01-10 DIAGNOSIS — I2699 Other pulmonary embolism without acute cor pulmonale: Secondary | ICD-10-CM | POA: Diagnosis not present

## 2022-01-10 DIAGNOSIS — E785 Hyperlipidemia, unspecified: Secondary | ICD-10-CM

## 2022-01-10 LAB — RESP PANEL BY RT-PCR (FLU A&B, COVID) ARPGX2
Influenza A by PCR: NEGATIVE
Influenza B by PCR: NEGATIVE
SARS Coronavirus 2 by RT PCR: NEGATIVE

## 2022-01-10 LAB — CBC
HCT: 40.2 % (ref 39.0–52.0)
Hemoglobin: 13.7 g/dL (ref 13.0–17.0)
MCH: 32 pg (ref 26.0–34.0)
MCHC: 34.1 g/dL (ref 30.0–36.0)
MCV: 93.9 fL (ref 80.0–100.0)
Platelets: 194 10*3/uL (ref 150–400)
RBC: 4.28 MIL/uL (ref 4.22–5.81)
RDW: 13.5 % (ref 11.5–15.5)
WBC: 6.1 10*3/uL (ref 4.0–10.5)
nRBC: 0 % (ref 0.0–0.2)

## 2022-01-10 LAB — BRAIN NATRIURETIC PEPTIDE: B Natriuretic Peptide: 39.7 pg/mL (ref 0.0–100.0)

## 2022-01-10 LAB — COMPREHENSIVE METABOLIC PANEL
ALT: 21 U/L (ref 0–44)
AST: 26 U/L (ref 15–41)
Albumin: 3.6 g/dL (ref 3.5–5.0)
Alkaline Phosphatase: 46 U/L (ref 38–126)
Anion gap: 7 (ref 5–15)
BUN: 28 mg/dL — ABNORMAL HIGH (ref 8–23)
CO2: 29 mmol/L (ref 22–32)
Calcium: 9.4 mg/dL (ref 8.9–10.3)
Chloride: 104 mmol/L (ref 98–111)
Creatinine, Ser: 1.14 mg/dL (ref 0.61–1.24)
GFR, Estimated: >60 mL/min (ref >60)
Glucose, Bld: 106 mg/dL — ABNORMAL HIGH (ref 70–99)
Potassium: 4 mmol/L (ref 3.5–5.1)
Sodium: 140 mmol/L (ref 135–145)
Total Bilirubin: 0.7 mg/dL (ref 0.3–1.2)
Total Protein: 7.1 g/dL (ref 6.5–8.1)

## 2022-01-10 LAB — D-DIMER, QUANTITATIVE: D-Dimer, Quant: 6.2 ug/mL-FEU — ABNORMAL HIGH (ref 0.00–0.50)

## 2022-01-10 LAB — LACTIC ACID, PLASMA: Lactic Acid, Venous: 0.9 mmol/L (ref 0.5–1.9)

## 2022-01-10 LAB — PROTIME-INR
INR: 1 (ref 0.8–1.2)
Prothrombin Time: 13.1 seconds (ref 11.4–15.2)

## 2022-01-10 LAB — TROPONIN I (HIGH SENSITIVITY): Troponin I (High Sensitivity): 5 ng/L (ref <18)

## 2022-01-10 LAB — APTT: aPTT: 28 seconds (ref 24–36)

## 2022-01-10 MED ORDER — HEPARIN (PORCINE) 25000 UT/250ML-% IV SOLN
1300.0000 [IU]/h | INTRAVENOUS | Status: DC
  Administered 2022-01-10 – 2022-01-11 (×2): 1450 [IU]/h via INTRAVENOUS
  Administered 2022-01-12: 1300 [IU]/h via INTRAVENOUS
  Filled 2022-01-10 (×3): qty 250

## 2022-01-10 MED ORDER — IOHEXOL 350 MG/ML SOLN
75.0000 mL | Freq: Once | INTRAVENOUS | Status: AC | PRN
  Administered 2022-01-10: 75 mL via INTRAVENOUS

## 2022-01-10 MED ORDER — HEPARIN BOLUS VIA INFUSION
6000.0000 [IU] | Freq: Once | INTRAVENOUS | Status: AC
  Administered 2022-01-10: 6000 [IU] via INTRAVENOUS

## 2022-01-10 MED ORDER — IRBESARTAN 150 MG PO TABS
75.0000 mg | ORAL_TABLET | Freq: Every day | ORAL | Status: DC
  Administered 2022-01-11 – 2022-01-12 (×2): 75 mg via ORAL
  Filled 2022-01-10 (×2): qty 1
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
## 2022-01-10 NOTE — Assessment & Plan Note (Signed)
Nonocclusive left femoral and popliteal DVT seen on Doppler. -Continue IV heparin infusion as above

## 2022-01-10 NOTE — Assessment & Plan Note (Signed)
-   Continue home statin °

## 2022-01-10 NOTE — Progress Notes (Signed)
ANTICOAGULATION CONSULT NOTE - Initial Consult  Pharmacy Consult for Heparin Indication: pulmonary embolus  No Known Allergies  Patient Measurements:   Heparin Dosing Weight: 79.4 kg  Vital Signs: Temp: 98.2 F (36.8 C) (09/10 1849) Temp Source: Oral (09/10 1849) BP: 155/88 (09/10 1849) Pulse Rate: 47 (09/10 1849)  Labs: Recent Labs    01/10/22 1455 01/10/22 1615 01/10/22 1810  HGB 13.7  --   --   HCT 40.2  --   --   PLT 194  --   --   APTT  --   --  28  LABPROT  --   --  13.1  INR  --   --  1.0  CREATININE 1.14  --   --   TROPONINIHS  --  5  --     CrCl cannot be calculated (Unknown ideal weight.).   Medical History: Past Medical History:  Diagnosis Date   Closed fracture of left olecranon process 11/23/2014   Colon polyps    Dysphagia    Hemorrhoids    Nondisplaced fracture of lateral end of left clavicle 11/23/2014    Medications:  (Not in a hospital admission)  Scheduled:  Infusions:  PRN:   Assessment: 31 yom with a history of provoked DVT, HTN, HLD. Patient is presenting with SOB. Heparin per pharmacy consult placed for pulmonary embolus.  CT Bilateral PE w/ RHS  Patient is not on anticoagulation prior to arrival.  Hgb 13.7; plt 194 D-Dimer 6.20 aPTT 28; PT / INR 13.1/1  Goal of Therapy:  Heparin level 0.3-0.7 units/ml Monitor platelets by anticoagulation protocol: Yes   Plan:  Give IV heparin 6000 units bolus x 1 Start heparin infusion at 1450 units/hr Check anti-Xa level at 0200 and daily while on heparin Continue to monitor H&H and platelets  Lorelei Pont, PharmD, BCPS 01/10/2022 7:09 PM ED Clinical Pharmacist -  [phone]

## 2022-01-10 NOTE — Progress Notes (Signed)
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 Normally very active.  Developed acute dyspnea with exertion yesterday, no dizziness.   D-dimer elevated.CTA chest revealed acute bilateral PE with right heart strain. He is normotensive, on room air with troponin of 5. Nonocclusion left DVT seen on doppler.   Getting started on IV heparin and will admit for echocardiogram and further observation.   Plan of care: The patient is accepted for admission to Telemetry unit, at [facility] or [facility] depending on bed availability.  EDP to continue care of pt while he remains in ED.   Author: Orene Desanctis, DO 01/10/2022  Check [email or web address] for on-call coverage.  Nursing staff, Please call [place] number on Amion as soon as patient's arrival, so appropriate admitting provider can evaluate the pt.
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
## 2022-01-10 NOTE — Assessment & Plan Note (Signed)
Continue home antihypertensives 

## 2022-01-10 NOTE — Assessment & Plan Note (Addendum)
3.9cm with annual CTA or MRA recommended.  Patient reports that he has seen a sports medicine cardiologist recently and told that this was normal variant for active athletes.

## 2022-01-10 NOTE — Progress Notes (Signed)
RT to triage to assess patient for shortness of breath. Upon arrival pt able to speak in full sentences, no distress noted and clear bilateral breath sounds. SpO2 98% on room air. Pt endorses shortness of breath with exertion and with to much talking, " feels like I'm at a higher altitude". Rest is the only thing that seems to give him relief. RT will continue to be available as needed.

## 2022-01-10 NOTE — ED Notes (Signed)
Report called to [facility] RN @ [facility]

## 2022-01-10 NOTE — Assessment & Plan Note (Addendum)
-  Bilateral submassive PE with right heart strain.  Hemodynamically stable on presentation. Hx of provoked DVT x1 but denies any trauma to LE or any other risk factors -continue IV heparin with eventual transition to oral anticoagulation -obtain echocardiogram -reports hypercoagulable work-up in the past but unable to locate this in our records.  No family history of coagulopathy.  Will obtain protein C, protein S, Antithrombin, prothrombin gene mutation, factor V work-up.  Likely will need to follow-up with hematology.

## 2022-01-10 NOTE — ED Triage Notes (Signed)
Pt arrives pov ambulatory c/o intermittent shob x 1 week. 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 States he feels shob with exertion and like he cant take a deep breath. States "it feels like I am at altitude" Denies hx of lung issues, cough.

## 2022-01-11 ENCOUNTER — Inpatient Hospital Stay (HOSPITAL_COMMUNITY): Payer: BC Managed Care – PPO

## 2022-01-11 DIAGNOSIS — I82412 Acute embolism and thrombosis of left femoral vein: Secondary | ICD-10-CM | POA: Diagnosis present

## 2022-01-11 DIAGNOSIS — E785 Hyperlipidemia, unspecified: Secondary | ICD-10-CM | POA: Diagnosis present

## 2022-01-11 DIAGNOSIS — I2699 Other pulmonary embolism without acute cor pulmonale: Secondary | ICD-10-CM | POA: Diagnosis not present

## 2022-01-11 DIAGNOSIS — R0609 Other forms of dyspnea: Secondary | ICD-10-CM | POA: Diagnosis present

## 2022-01-11 DIAGNOSIS — Z20822 Contact with and (suspected) exposure to covid-19: Secondary | ICD-10-CM | POA: Diagnosis present

## 2022-01-11 DIAGNOSIS — I82432 Acute embolism and thrombosis of left popliteal vein: Secondary | ICD-10-CM | POA: Diagnosis present

## 2022-01-11 DIAGNOSIS — Z79899 Other long term (current) drug therapy: Secondary | ICD-10-CM | POA: Diagnosis not present

## 2022-01-11 DIAGNOSIS — I2609 Other pulmonary embolism with acute cor pulmonale: Secondary | ICD-10-CM | POA: Diagnosis not present

## 2022-01-11 DIAGNOSIS — Z86718 Personal history of other venous thrombosis and embolism: Secondary | ICD-10-CM | POA: Diagnosis not present

## 2022-01-11 DIAGNOSIS — I10 Essential (primary) hypertension: Secondary | ICD-10-CM | POA: Diagnosis present

## 2022-01-11 DIAGNOSIS — I7121 Aneurysm of the ascending aorta, without rupture: Secondary | ICD-10-CM | POA: Diagnosis present

## 2022-01-11 DIAGNOSIS — Z7982 Long term (current) use of aspirin: Secondary | ICD-10-CM | POA: Diagnosis not present

## 2022-01-11 LAB — CBC
HCT: 37.6 % — ABNORMAL LOW (ref 39.0–52.0)
Hemoglobin: 12.7 g/dL — ABNORMAL LOW (ref 13.0–17.0)
MCH: 32 pg (ref 26.0–34.0)
MCHC: 33.8 g/dL (ref 30.0–36.0)
MCV: 94.7 fL (ref 80.0–100.0)
Platelets: 178 10*3/uL (ref 150–400)
RBC: 3.97 MIL/uL — ABNORMAL LOW (ref 4.22–5.81)
RDW: 13.2 % (ref 11.5–15.5)
WBC: 5.5 10*3/uL (ref 4.0–10.5)
nRBC: 0 % (ref 0.0–0.2)

## 2022-01-11 LAB — ECHOCARDIOGRAM COMPLETE
AR max vel: 3.05 cm2
AV Area VTI: 3.03 cm2
AV Area mean vel: 2.94 cm2
AV Mean grad: 4 mmHg
AV Peak grad: 7.3 mmHg
Ao pk vel: 1.35 m/s
Area-P 1/2: 3.12 cm2
Calc EF: 62.3 %
Height: 68 in
MV M vel: 3.95 m/s
MV Peak grad: 62.5 mmHg
S' Lateral: 3.6 cm
Single Plane A2C EF: 58.2 %
Single Plane A4C EF: 64 %
Weight: 2800.72 oz

## 2022-01-11 LAB — HEPARIN LEVEL (UNFRACTIONATED)
Heparin Unfractionated: 0.52 IU/mL (ref 0.30–0.70)
Heparin Unfractionated: 0.87 IU/mL — ABNORMAL HIGH (ref 0.30–0.70)
Heparin Unfractionated: 0.63 IU/mL (ref 0.30–0.70)

## 2022-01-11 LAB — ANTITHROMBIN III: AntiThromb III Func: 82 % (ref 75–120)

## 2022-01-11 MED ORDER — EZETIMIBE 10 MG PO TABS
5.0000 mg | ORAL_TABLET | Freq: Every day | ORAL | Status: DC
  Administered 2022-01-11 – 2022-01-12 (×2): 5 mg via ORAL
  Filled 2022-01-11 (×2): qty 1

## 2022-01-11 MED ORDER — OMEGA-3-ACID ETHYL ESTERS 1 G PO CAPS
1.0000 g | ORAL_CAPSULE | Freq: Every day | ORAL | Status: DC
  Administered 2022-01-11 – 2022-01-12 (×2): 1 g via ORAL
  Filled 2022-01-11 (×2): qty 1

## 2022-01-11 NOTE — Progress Notes (Signed)
ANTICOAGULATION CONSULT NOTE  Pharmacy Consult for Heparin Indication: pulmonary embolus  No Known Allergies  Patient Measurements: Height: '5\' 8"'$  (172.7 cm) Weight: 79.4 kg (175 lb 0.7 oz) IBW/kg (Calculated) : 68.4 Heparin Dosing Weight: 79.4 kg  Vital Signs: Temp: 97.7 F (36.5 C) (09/11 0804) Temp Source: Oral (09/11 0804) BP: 131/85 (09/11 0804) Pulse Rate: 54 (09/11 0804)  Labs: Recent Labs    01/10/22 1455 01/10/22 1615 01/10/22 1810 01/11/22 0250 01/11/22 0746  HGB 13.7  --   --  12.7*  --   HCT 40.2  --   --  37.6*  --   PLT 194  --   --  178  --   APTT  --   --  28  --   --   LABPROT  --   --  13.1  --   --   INR  --   --  1.0  --   --   HEPARINUNFRC  --   --   --  0.52 0.87*  CREATININE 1.14  --   --   --   --   TROPONINIHS  --  5  --   --   --      Estimated Creatinine Clearance: 63.3 mL/min (by C-G formula based on SCr of 1.14 mg/dL).   Medical History: Past Medical History:  Diagnosis Date   Closed fracture of left olecranon process 11/23/2014   Colon polyps    Dysphagia    Hemorrhoids    Nondisplaced fracture of lateral end of left clavicle 11/23/2014    Medications:  Medications Prior to Admission  Medication Sig Dispense Refill Last Dose   aspirin 81 MG tablet Take 81 mg by mouth daily.     01/10/2022   Coenzyme Q10 (COQ-10 PO) Take 1 capsule by mouth daily.   01/10/2022   ezetimibe (ZETIA) 10 MG tablet Take 5 mg by mouth daily.   01/10/2022   fish oil-omega-3 fatty acids 1000 MG capsule Take 1 g by mouth daily.   01/10/2022   valsartan (DIOVAN) 80 MG tablet Take 80 mg by mouth daily.   01/10/2022    Scheduled:  Infusions:  PRN:   Assessment: 35 yom with a history of provoked DVT, HTN, HLD. Patient is presenting with SOB. Heparin per pharmacy consult placed for pulmonary embolus.  CT Bilateral PE w/ RHS  Patient is not on anticoagulation prior to arrival.  01/11/22 Confirmatory heparin level 0.87 (supratherapeutic) on 1450  units/hr CBC: Hgb 12.7, Hct 37.6, Plts 178 No bleeding or infusion-related issues reported  Goal of Therapy:  Heparin level 0.3-0.7 units/ml Monitor platelets by anticoagulation protocol: Yes   Plan:  Decrease heparin infusion to 1300 units/hr Repeat HL in 6 hours Daily heparin level and CBC   Meaghan Collins, Pharm.D. Candidate 01/11/2022, 9:56 AM    I discussed / reviewed the pharmacy note by Dr. Theda Sers, PharmD candidate and I agree with the resident's findings and plans as documented.  Keri Veale S. Alford Highland, PharmD, BCPS Clinical Staff Pharmacist [email or web address]

## 2022-01-11 NOTE — Progress Notes (Signed)
ANTICOAGULATION CONSULT NOTE  Pharmacy Consult for Heparin Indication: pulmonary embolus  No Known Allergies  Patient Measurements: Height: '5\' 8"'$  (172.7 cm) Weight: 79.4 kg (175 lb 0.7 oz) IBW/kg (Calculated) : 68.4 Heparin Dosing Weight: 79.4 kg  Vital Signs: Temp: 97.8 F (36.6 C) (09/11 1543) Temp Source: Oral (09/11 1543) BP: 146/82 (09/11 1543) Pulse Rate: 46 (09/11 1543)  Labs: Recent Labs    01/10/22 1455 01/10/22 1615 01/10/22 1810 01/11/22 0250 01/11/22 0746 01/11/22 1516  HGB 13.7  --   --  12.7*  --   --   HCT 40.2  --   --  37.6*  --   --   PLT 194  --   --  178  --   --   APTT  --   --  28  --   --   --   LABPROT  --   --  13.1  --   --   --   INR  --   --  1.0  --   --   --   HEPARINUNFRC  --   --   --  0.52 0.87* 0.63  CREATININE 1.14  --   --   --   --   --   TROPONINIHS  --  5  --   --   --   --      Estimated Creatinine Clearance: 63.3 mL/min (by C-G formula based on SCr of 1.14 mg/dL).  Assessment: 43 yom with a history of provoked DVT, HTN, HLD. Patient is presenting with SOB. Heparin per pharmacy consult placed for pulmonary embolus.  CT Bilateral PE w/ RHS  Patient is not on anticoagulation prior to arrival.  01/11/22 Heparin level 0.63 - (therapeutic) after heparin rate decreased from 1450 units/hr to 1300 units/hr CBC: Hgb 12.7, Hct 37.6, Plts 178 No bleeding or infusion-related issues reported  Goal of Therapy:  Heparin level 0.3-0.7 units/ml Monitor platelets by anticoagulation protocol: Yes   Plan:  continue heparin infusion @ 1300 units/hr Daily heparin level and CBC F/u transition to Xarelto tomorrow if remains stable  Eudelia Bunch, Pharm.D 01/11/2022 4:48 PM

## 2022-01-11 NOTE — Progress Notes (Signed)
ANTICOAGULATION CONSULT NOTE - follow up  Pharmacy Consult for Heparin Indication: pulmonary embolus  No Known Allergies  Patient Measurements: Height: '5\' 8"'$  (172.7 cm) Weight: 79.4 kg (175 lb 0.7 oz) IBW/kg (Calculated) : 68.4 Heparin Dosing Weight: 79.4 kg  Vital Signs: Temp: 98.1 F (36.7 C) (09/11 0044) Temp Source: Oral (09/11 0044) BP: 117/69 (09/11 0044) Pulse Rate: 50 (09/11 0044)  Labs: Recent Labs    01/10/22 1455 01/10/22 1615 01/10/22 1810 01/11/22 0250  HGB 13.7  --   --   --   HCT 40.2  --   --   --   PLT 194  --   --   --   APTT  --   --  28  --   LABPROT  --   --  13.1  --   INR  --   --  1.0  --   HEPARINUNFRC  --   --   --  0.52  CREATININE 1.14  --   --   --   TROPONINIHS  --  5  --   --      Estimated Creatinine Clearance: 63.3 mL/min (by C-G formula based on SCr of 1.14 mg/dL).   Medical History: Past Medical History:  Diagnosis Date   Closed fracture of left olecranon process 11/23/2014   Colon polyps    Dysphagia    Hemorrhoids    Nondisplaced fracture of lateral end of left clavicle 11/23/2014    Medications:  Medications Prior to Admission  Medication Sig Dispense Refill Last Dose   aspirin 81 MG tablet Take 81 mg by mouth daily.     01/10/2022   Coenzyme Q10 (COQ-10 PO) Take 1 capsule by mouth daily.   01/10/2022   ezetimibe (ZETIA) 10 MG tablet Take 5 mg by mouth daily.   01/10/2022   fish oil-omega-3 fatty acids 1000 MG capsule Take 1 g by mouth daily.   01/10/2022   valsartan (DIOVAN) 80 MG tablet Take 80 mg by mouth daily.   01/10/2022    Scheduled:  Infusions:  PRN:   Assessment: 72 yom with a history of provoked DVT, HTN, HLD. Patient is presenting with SOB. Heparin per pharmacy consult placed for pulmonary embolus.  CT Bilateral PE w/ RHS  Patient is not on anticoagulation prior to arrival.  HL 0.52 therapeutic on 1450 units/hr No bleeding reported  Goal of Therapy:  Heparin level 0.3-0.7 units/ml Monitor  platelets by anticoagulation protocol: Yes   Plan:  Continue heparin infusion at 1450 units/hr Confirmatory level in 6 hours Daily CBC Continue to monitor H&H and platelets  Dolly Rias RPh 01/11/2022, 3:50 AM
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
## 2022-01-12 ENCOUNTER — Telehealth (HOSPITAL_COMMUNITY): Payer: Self-pay | Admitting: Pharmacy Technician

## 2022-01-12 ENCOUNTER — Other Ambulatory Visit (HOSPITAL_COMMUNITY): Payer: Self-pay

## 2022-01-12 ENCOUNTER — Telehealth: Payer: Self-pay | Admitting: Oncology

## 2022-01-12 DIAGNOSIS — I2699 Other pulmonary embolism without acute cor pulmonale: Secondary | ICD-10-CM | POA: Diagnosis not present

## 2022-01-12 LAB — PROTEIN S, TOTAL: Protein S Ag, Total: 57 % — ABNORMAL LOW (ref 60–150)

## 2022-01-12 LAB — PROTEIN C ACTIVITY: Protein C Activity: 89 % (ref 73–180)

## 2022-01-12 LAB — CBC
HCT: 37.4 % — ABNORMAL LOW (ref 39.0–52.0)
Hemoglobin: 12.7 g/dL — ABNORMAL LOW (ref 13.0–17.0)
MCH: 32 pg (ref 26.0–34.0)
MCHC: 34 g/dL (ref 30.0–36.0)
MCV: 94.2 fL (ref 80.0–100.0)
Platelets: 169 10*3/uL (ref 150–400)
RBC: 3.97 MIL/uL — ABNORMAL LOW (ref 4.22–5.81)
RDW: 13 % (ref 11.5–15.5)
WBC: 4.9 10*3/uL (ref 4.0–10.5)
nRBC: 0 % (ref 0.0–0.2)

## 2022-01-12 LAB — HEPARIN LEVEL (UNFRACTIONATED): Heparin Unfractionated: 0.5 IU/mL (ref 0.30–0.70)

## 2022-01-12 MED ORDER — RIVAROXABAN (XARELTO) VTE STARTER PACK (15 & 20 MG)
ORAL_TABLET | ORAL | 0 refills | Status: DC
  Filled 2022-01-12: qty 51, 30d supply, fill #0

## 2022-01-12 MED ORDER — RIVAROXABAN 15 MG PO TABS: 15.0000 mg | ORAL_TABLET | Freq: Two times a day (BID) | ORAL | Status: DC

## 2022-01-12 MED ORDER — RIVAROXABAN 20 MG PO TABS: 20.0000 mg | ORAL_TABLET | Freq: Every day | ORAL | Status: DC

## 2022-01-12 NOTE — Progress Notes (Signed)
  Transition of Care [facility]) Screening Note   Patient Details  Name: GEORGE HAGGART Date of Birth: March 16, 1956   [facility] CM/SW Contact:    Roseanne Kaufman, RN Phone Number: 01/12/2022, 10:51 AM    Transition of Care Department [facility]) has reviewed patient and no TOC needs have been identified at this time. We will continue to monitor patient advancement through interdisciplinary progression rounds. If new patient transition needs arise, please place a TOC consult.

## 2022-01-12 NOTE — Progress Notes (Signed)
[place] for Xarelto Indication: pulmonary embolus  No Known Allergies  Patient Measurements: Height: '5\' 8"'$  (172.7 cm) Weight: 79.4 kg (175 lb 0.7 oz) IBW/kg (Calculated) : 68.4 Heparin Dosing Weight: 79.4 kg  Vital Signs: Temp: 97.9 F (36.6 C) (09/12 0535) Temp Source: Oral (09/12 0535) BP: 126/78 (09/12 0535) Pulse Rate: 46 (09/12 0535)  Labs: Recent Labs    01/10/22 1455 01/10/22 1455 01/10/22 1615 01/10/22 1810 01/11/22 0250 01/11/22 0746 01/11/22 1516 01/12/22 0349  HGB 13.7  --   --   --  12.7*  --   --  12.7*  HCT 40.2  --   --   --  37.6*  --   --  37.4*  PLT 194  --   --   --  178  --   --  169  APTT  --   --   --  28  --   --   --   --   LABPROT  --   --   --  13.1  --   --   --   --   INR  --   --   --  1.0  --   --   --   --   HEPARINUNFRC  --    < >  --   --  0.52 0.87* 0.63 0.50  CREATININE 1.14  --   --   --   --   --   --   --   TROPONINIHS  --   --  5  --   --   --   --   --    < > = values in this interval not displayed.     Estimated Creatinine Clearance: 63.3 mL/min (by C-G formula based on SCr of 1.14 mg/dL).  Assessment: 14 yom with a history of provoked DVT, HTN, HLD. Patient is presenting with SOB.  Xarelto per pharmacy consult placed for pulmonary embolus.  CT Bilateral PE w/ RHS Venous Img Lower Bilateral - Non-occlusive DVT within the left femoral and popliteal veins  Patient is not on anticoagulation prior to arrival.  01/12/22 Heparin level 0.50 - (therapeutic) after heparin rate decreased from 1450 units/hr to 1300 units/hr CBC: Hgb 12.7, Hct 37.4, Plts 169 No bleeding or infusion-related issues reported.    Plan:  -Stop heparin.  -Transition to Xarelto 15 mg PO BID x21 days, then 20 mg PO once daily -Pt has been educated and given a 30-day free coupon.   Donney Rankins, Pharm.D Candidate 01/12/2022 11:26 AM   I discussed / reviewed the pharmacy note by Dr. Theda Sers, PharmD  candidate, and I agree with the resident's findings and plans as documented. Script filled and delivered from [employer] for [place] pack. Patient ok with subsequent month's cost. Terilyn Sano S. Alford Highland, PharmD, BCPS Clinical Staff Pharmacist [email or web address]

## 2022-01-12 NOTE — Telephone Encounter (Signed)
Pharmacy Patient Advocate Encounter  Insurance verification completed.    The patient is insured through [employer]   The patient is currently admitted and ran test claims for the following: Xarelto .  Copays and coinsurance results were relayed to Inpatient clinical team.

## 2022-01-12 NOTE — Progress Notes (Signed)
Mobility Specialist Cancellation/Refusal Note:    01/12/22 1025  Mobility  Activity Refused mobility    Reason for Cancellation/Refusal: Pt declined mobility at this time. Pt in IND & getting ready to head home. Mobility signing off.       [facility]

## 2022-01-12 NOTE — Telephone Encounter (Signed)
Scheduled appt per 9/12 referral. Pt is aware of appt date and time. Pt is aware to arrive 15 mins prior to appt time and to bring and updated insurance card. Pt is aware of appt location.   
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
## 2022-01-12 NOTE — TOC Benefit Eligibility Note (Signed)
Patient Teacher, English as a foreign language completed.    The patient is currently admitted and upon discharge could be taking Xarelto 20 mg.  The current 30 day co-pay is $532.18 due to a $7,000 deductible.   The patient is insured through [employer] [place], [place] Patient [place] Patient Advocate Team Direct Number: [phone]  Fax: [phone]

## 2022-01-15 LAB — FACTOR 5 LEIDEN

## 2022-01-17 DIAGNOSIS — M25511 Pain in right shoulder: Secondary | ICD-10-CM | POA: Diagnosis not present

## 2022-01-17 DIAGNOSIS — M25551 Pain in right hip: Secondary | ICD-10-CM | POA: Diagnosis not present

## 2022-01-18 LAB — PROTHROMBIN GENE MUTATION

## 2022-01-26 DIAGNOSIS — M25511 Pain in right shoulder: Secondary | ICD-10-CM | POA: Diagnosis not present

## 2022-01-26 DIAGNOSIS — M25551 Pain in right hip: Secondary | ICD-10-CM | POA: Diagnosis not present

## 2022-01-26 NOTE — Progress Notes (Signed)
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 follow established guidelines and fit the clinical circumstance. This result cannot predict the occurrence or recurrence of a thrombotic event. Comments: Genetic counseling is recommended to discuss the potential  clinical implications of positive results, as well as recommendations for testing family members. Genetic Coordinators are available for health care providers to discuss results at [phone]). Test Details: Variant analyzed: c.*97G>A, previously referred to as G20210A Methods/Limitations: DNA analysis of t                          he F2 gene (NM_[id number]) was performed by PCR amplification followed by restriction enzyme analysis. The diagnostic sensitivity is >99%. Results must be combined with clinical information for the most accurate interpretation. Molecular-based testing is highly accurate, but as in any laboratory test, diagnostic errors may occur. False positive or false negative results may occur for reasons that include genetic variants, blood transfusions, bone marrow transplantation, somatic or tissue-specific mosaicism, mislabeled samples, or erroneous representation of family relationships. This test was developed and its performance characteristics determined by [facility]. It has not been cleared or approved by the Food and Drug Administration. References: [facility], [facility]; [facility] Professional Practice and Guidelines Committee. Addendum: [place] consensus statement on factor V  Leiden mutation testing. Genet Med. 2021 Mar 5.                           doi: 10.1038/s41436-021-01108-x. PMID: 57846962. Kristopher Oppenheim. Prothrombin Thrombophilia. 2006 Jul 25 [Updated 2021 Feb 4]. In: Tarri Glenn, Ardinger HH, Pagon RA, et al., editors. GeneReviews(R) [Internet]. [address] ([place]): [place] [employer] [place], [place]; 1993-2021. Available from: [email or web address]-[email or web address]/ Terrilee Files, Carla Drape, Marin Shutter CS; [facility] Laboratory Quality Assurance Committee. Venous thromboembolism laboratory testing (factor V Leiden and factor II c.*97G>A), 2018 update: a technical standard of  the [place] ([facility]). Genet Med. 2018 Dec;20(12):1489-1498. doi: 95.2841/L24401-027-2536-U. Epub 2018 Oct 5. PMID: 44034742.    Reviewed by: 01/11/2022 Comment   Final   Comment: (NOTE) Technical Component performed at [facility] Professional Component performed by: Lubertha South, Ph.D., [facility] Director, Molecular Genetics [address] [place] [address] Performed At: [facility] [address] [place], [place] [id number] Katina Degree MDPhD [id number]    Heparin Unfractionated 01/11/2022 0.87 (H)  0.30 - 0.70 IU/mL Final   Comment: (NOTE) The clinical reportable range upper limit is being lowered to >1.10 to align with the FDA approved guidance for the current laboratory assay.  If heparin results are below expected values, and patient dosage has  been confirmed, suggest follow up testing of antithrombin III levels. Performed at [facility], [place] [address] [place], [place] [address]    Heparin Unfractionated 01/11/2022 0.63  0.30 - 0.70 IU/mL Final   Comment: (NOTE) The clinical reportable range upper limit is being lowered to >1.10 to align with the FDA approved guidance for the current laboratory assay.  If heparin results are below expected values, and patient dosage has  been confirmed, suggest follow up testing of antithrombin III levels. Performed at [facility], [place] [address] [place], [place] [address]    Heparin Unfractionated 01/12/2022 0.50  0.30 - 0.70 IU/mL Final   Comment: (NOTE) The clinical reportable range upper limit is being lowered to >1.10 to align with the FDA approved guidance for the current laboratory assay.  If heparin results are below expected values, and patient dosage has  been confirmed, suggest follow up testing of antithrombin III levels. Performed at [facility], [place] [address], [place], [place] [address]    WBC 01/12/2022 4.9   4.0 - 10.5 K/uL Final   RBC 01/12/2022 3.97 (L)  4.22 - 5.81 MIL/uL Final   Hemoglobin 01/12/2022 12.7 (L)  13.0 - 17.0 g/dL Final   HCT 01/12/2022 37.4 (L)  39.0 - 52.0 % Final   MCV 01/12/2022 94.2  80.0 - 100.0 fL Final   MCH 01/12/2022 32.0  26.0 - 34.0 pg Final   MCHC 01/12/2022 34.0  30.0 - 36.0 g/dL Final   RDW 01/12/2022 13.0  11.5 - 15.5 % Final   Platelets 01/12/2022 169  150 - 400 K/uL Final   nRBC 01/12/2022 0.0  0.0 - 0.2 % Final   Performed at [facility], [place] [address] [place], [place] [address]    RADIOGRAPHIC STUDIES: I have personally reviewed the radiological images as listed and agree with the findings in the report  No results found.  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  Treated with Xarelto x 3 months  November 2021:  DVT right gastronemius vein Treated with Xarelto x 3 months  January 10 2022:  Acute submassive PE   Hypercoagulable state evaluation:  Performed in September 2023 in response to submassive PE.   Notable for finding of Prothrombin Gene Mutation.   Protein S Ag level slightly low at 57%.  Can see in setting of acute VTE.  Not considered clinically significant until Protein S activity < 40%   Prothrombin Gene Mutation:  Prothrombin (Factor II):  A vitamin K dependent protein synthesized by hepatocytes which is the  precursor of thrombin.  Thrombin cleaves fibrinogen to fibrin, which becomes crosslinked to form a fibrin clot. Thrombin also acts on platelets, factor VIII, Factor V, factor XIII and thrombin-activatable fibrinolysis inhibitor (TAFI; regulates clot lysis).  The G20210A point mutation in the prothrombin gene results from a substitution of adenine (A) for guanine (G) at position 20210 in a noncoding region of the gene. Most individuals with  this variant are heterozygous.  Homozygosity has been reported in a small percentage. Transmission is autosomal dominant.  Risk of VTE in heterozygous individuals is increased 3 to 4 fold  compared with noncarriers.   Heterozygotes for prothrombin G20210A have 30 % higher plasma prothrombin levels than controls. Homozygotes have even higher levels.  This gene mutation increases prothrombin biosynthesis.   Encouraged patient to discuss the gene findings with his children  Therapeutics:  Given the history of recurrent VTE which has culminated in the development of submassive PE life long anticoagulation with a DOAC is recommended.   Over time may be able to consider reducing dose to prophylactic level.     Cancer Staging  No matching staging information was found for the patient.   No problem-specific Assessment & Plan notes found for this encounter.   Orders Placed This Encounter  Procedures   CBC with Differential ([place] Only)    Standing Status:   Future    Number of Occurrences:   1    Standing Expiration Date:   01/30/2023   CMP ([place] only)    Standing Status:   Future    Number of Occurrences:   1    Standing Expiration Date:   01/30/2023   D-dimer, quantitative    Standing Status:   Future    Number of Occurrences:   1    Standing Expiration Date:   01/30/2023   CBC with Differential (Cancer Center Only)    Standing Status:   Future    Number of Occurrences:   1    Standing Expiration Date:   01/30/2023    All questions were answered. The patient knows to call the clinic with any problems, questions or concerns.  This note was electronically signed.    Barbee Cough, MD  02/15/2022 12:22 PM

## 2022-01-29 ENCOUNTER — Other Ambulatory Visit: Payer: Self-pay

## 2022-01-29 ENCOUNTER — Inpatient Hospital Stay: Payer: BC Managed Care – PPO

## 2022-01-29 ENCOUNTER — Inpatient Hospital Stay: Payer: BC Managed Care – PPO | Attending: Oncology | Admitting: Oncology

## 2022-01-29 VITALS — BP 136/84 | HR 48 | Temp 97.8°F | Resp 15 | Wt 174.7 lb

## 2022-01-29 DIAGNOSIS — I2699 Other pulmonary embolism without acute cor pulmonale: Secondary | ICD-10-CM

## 2022-01-29 DIAGNOSIS — Z7901 Long term (current) use of anticoagulants: Secondary | ICD-10-CM | POA: Diagnosis not present

## 2022-01-29 DIAGNOSIS — D6852 Prothrombin gene mutation: Secondary | ICD-10-CM | POA: Diagnosis not present

## 2022-01-29 DIAGNOSIS — I82412 Acute embolism and thrombosis of left femoral vein: Secondary | ICD-10-CM

## 2022-01-29 DIAGNOSIS — Z86718 Personal history of other venous thrombosis and embolism: Secondary | ICD-10-CM | POA: Diagnosis not present

## 2022-01-29 DIAGNOSIS — Z803 Family history of malignant neoplasm of breast: Secondary | ICD-10-CM | POA: Insufficient documentation

## 2022-01-29 DIAGNOSIS — Z79899 Other long term (current) drug therapy: Secondary | ICD-10-CM | POA: Diagnosis not present

## 2022-01-29 LAB — CMP (CANCER CENTER ONLY)
ALT: 25 U/L (ref 0–44)
AST: 25 U/L (ref 15–41)
Albumin: 4.5 g/dL (ref 3.5–5.0)
Alkaline Phosphatase: 45 U/L (ref 38–126)
Anion gap: 3 — ABNORMAL LOW (ref 5–15)
BUN: 22 mg/dL (ref 8–23)
CO2: 32 mmol/L (ref 22–32)
Calcium: 9.5 mg/dL (ref 8.9–10.3)
Chloride: 103 mmol/L (ref 98–111)
Creatinine: 0.98 mg/dL (ref 0.61–1.24)
GFR, Estimated: >60 mL/min (ref >60)
Glucose, Bld: 97 mg/dL (ref 70–99)
Potassium: 4.4 mmol/L (ref 3.5–5.1)
Sodium: 138 mmol/L (ref 135–145)
Total Bilirubin: 0.5 mg/dL (ref 0.3–1.2)
Total Protein: 7.4 g/dL (ref 6.5–8.1)

## 2022-01-29 LAB — CBC WITH DIFFERENTIAL (CANCER CENTER ONLY)
Abs Immature Granulocytes: 0.01 10*3/uL (ref 0.00–0.07)
Basophils Absolute: 0.1 10*3/uL (ref 0.0–0.1)
Basophils Relative: 1 %
Eosinophils Absolute: 0.1 10*3/uL (ref 0.0–0.5)
Eosinophils Relative: 2 %
HCT: 43.4 % (ref 39.0–52.0)
Hemoglobin: 15 g/dL (ref 13.0–17.0)
Immature Granulocytes: 0 %
Lymphocytes Relative: 30 %
Lymphs Abs: 1.8 10*3/uL (ref 0.7–4.0)
MCH: 31.8 pg (ref 26.0–34.0)
MCHC: 34.6 g/dL (ref 30.0–36.0)
MCV: 92.1 fL (ref 80.0–100.0)
Monocytes Absolute: 0.6 10*3/uL (ref 0.1–1.0)
Monocytes Relative: 9 %
Neutro Abs: 3.6 10*3/uL (ref 1.7–7.7)
Neutrophils Relative %: 58 %
Platelet Count: 232 10*3/uL (ref 150–400)
RBC: 4.71 MIL/uL (ref 4.22–5.81)
RDW: 13.2 % (ref 11.5–15.5)
WBC Count: 6.1 10*3/uL (ref 4.0–10.5)
nRBC: 0 % (ref 0.0–0.2)

## 2022-01-29 LAB — D-DIMER, QUANTITATIVE: D-Dimer, Quant: 0.37 ug{FEU}/mL (ref 0.00–0.50)

## 2022-02-01 ENCOUNTER — Encounter: Payer: Self-pay | Admitting: Oncology

## 2022-02-04 ENCOUNTER — Other Ambulatory Visit: Payer: Self-pay

## 2022-02-04 MED ORDER — RIVAROXABAN 20 MG PO TABS: 20.0000 mg | ORAL_TABLET | Freq: Every day | ORAL | 1 refills | Status: DC

## 2022-02-05 DIAGNOSIS — R001 Bradycardia, unspecified: Secondary | ICD-10-CM | POA: Diagnosis not present

## 2022-02-05 DIAGNOSIS — I2699 Other pulmonary embolism without acute cor pulmonale: Secondary | ICD-10-CM | POA: Diagnosis not present

## 2022-02-05 DIAGNOSIS — Z136 Encounter for screening for cardiovascular disorders: Secondary | ICD-10-CM | POA: Diagnosis not present

## 2022-02-15 DIAGNOSIS — D6852 Prothrombin gene mutation: Secondary | ICD-10-CM | POA: Insufficient documentation

## 2022-02-15 DIAGNOSIS — Z7901 Long term (current) use of anticoagulants: Secondary | ICD-10-CM | POA: Insufficient documentation

## 2022-03-22 DIAGNOSIS — Z86718 Personal history of other venous thrombosis and embolism: Secondary | ICD-10-CM | POA: Diagnosis not present

## 2022-03-22 DIAGNOSIS — Z09 Encounter for follow-up examination after completed treatment for conditions other than malignant neoplasm: Secondary | ICD-10-CM | POA: Diagnosis not present

## 2022-03-22 DIAGNOSIS — Z7901 Long term (current) use of anticoagulants: Secondary | ICD-10-CM | POA: Diagnosis not present

## 2022-03-22 DIAGNOSIS — I2699 Other pulmonary embolism without acute cor pulmonale: Secondary | ICD-10-CM | POA: Diagnosis not present

## 2022-03-22 DIAGNOSIS — Z86711 Personal history of pulmonary embolism: Secondary | ICD-10-CM | POA: Diagnosis not present

## 2022-03-22 DIAGNOSIS — I10 Essential (primary) hypertension: Secondary | ICD-10-CM | POA: Diagnosis not present

## 2022-03-26 ENCOUNTER — Other Ambulatory Visit: Payer: Self-pay

## 2022-03-26 ENCOUNTER — Encounter: Payer: Self-pay | Admitting: Oncology

## 2022-03-26 MED ORDER — RIVAROXABAN 20 MG PO TABS: 20.0000 mg | ORAL_TABLET | Freq: Every day | ORAL | 1 refills | Status: DC

## 2022-03-29 ENCOUNTER — Other Ambulatory Visit: Payer: Self-pay | Admitting: Oncology

## 2022-03-29 MED ORDER — RIVAROXABAN 20 MG PO TABS: 20.0000 mg | ORAL_TABLET | Freq: Every day | ORAL | 1 refills | Status: DC

## 2022-04-01 DIAGNOSIS — H0102B Squamous blepharitis left eye, upper and lower eyelids: Secondary | ICD-10-CM | POA: Diagnosis not present

## 2022-04-01 DIAGNOSIS — H25813 Combined forms of age-related cataract, bilateral: Secondary | ICD-10-CM | POA: Diagnosis not present

## 2022-04-01 DIAGNOSIS — H43811 Vitreous degeneration, right eye: Secondary | ICD-10-CM | POA: Diagnosis not present

## 2022-04-01 DIAGNOSIS — H0102A Squamous blepharitis right eye, upper and lower eyelids: Secondary | ICD-10-CM | POA: Diagnosis not present

## 2022-05-07 ENCOUNTER — Inpatient Hospital Stay: Payer: BC Managed Care – PPO | Attending: Oncology | Admitting: Oncology

## 2022-05-07 VITALS — BP 125/82 | HR 49 | Temp 97.5°F | Resp 16 | Wt 175.3 lb

## 2022-05-07 DIAGNOSIS — I2699 Other pulmonary embolism without acute cor pulmonale: Secondary | ICD-10-CM | POA: Diagnosis not present

## 2022-05-07 DIAGNOSIS — Z79899 Other long term (current) drug therapy: Secondary | ICD-10-CM | POA: Diagnosis not present

## 2022-05-07 DIAGNOSIS — D6852 Prothrombin gene mutation: Secondary | ICD-10-CM | POA: Insufficient documentation

## 2022-05-07 DIAGNOSIS — Z7901 Long term (current) use of anticoagulants: Secondary | ICD-10-CM | POA: Diagnosis not present

## 2022-05-07 DIAGNOSIS — Z86718 Personal history of other venous thrombosis and embolism: Secondary | ICD-10-CM | POA: Insufficient documentation

## 2022-05-07 DIAGNOSIS — I82412 Acute embolism and thrombosis of left femoral vein: Secondary | ICD-10-CM | POA: Diagnosis not present
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
## 2022-05-08 ENCOUNTER — Telehealth: Payer: Self-pay | Admitting: Oncology

## 2022-05-08 NOTE — Telephone Encounter (Signed)
Left patient a vm regarding upcoming appointments  

## 2022-05-19 DIAGNOSIS — Z125 Encounter for screening for malignant neoplasm of prostate: Secondary | ICD-10-CM | POA: Diagnosis not present

## 2022-05-19 DIAGNOSIS — E782 Mixed hyperlipidemia: Secondary | ICD-10-CM | POA: Diagnosis not present

## 2022-05-19 DIAGNOSIS — Z0000 Encounter for general adult medical examination without abnormal findings: Secondary | ICD-10-CM | POA: Diagnosis not present

## 2022-06-04 ENCOUNTER — Encounter: Payer: Self-pay | Admitting: Oncology

## 2022-06-04 ENCOUNTER — Other Ambulatory Visit: Payer: Self-pay | Admitting: Oncology

## 2022-06-04 MED ORDER — RIVAROXABAN 20 MG PO TABS: 20.0000 mg | ORAL_TABLET | Freq: Every day | ORAL | 1 refills | Status: DC

## 2022-06-16 ENCOUNTER — Telehealth: Payer: Self-pay | Admitting: Gastroenterology

## 2022-06-16 NOTE — Telephone Encounter (Signed)
Hi Dr. Tarri Glenn,    We received a referral for patient to have a colonoscopy done, he does have GI history with Dr. Liliane Channel office specifically asking for you to continue his GI care. Records are available for you to review via Care Everywhere and advise on scheduling.    Thank you

## 2022-06-17 NOTE — Telephone Encounter (Signed)
Outside records reviewed.  Surveillance colonoscopy due in 2026.  Thank you.

## 2022-06-17 NOTE — Telephone Encounter (Signed)
Colonoscopy with Dr. Earlean Shawl 03/04/2020 showed large internal hemorrhoids and a 4 mm ascending colon polyp.  Surveillance colonoscopy recommended in 5 years.  Path results not included in these records.

## 2022-06-18 NOTE — Telephone Encounter (Signed)
Called patient to schedule left voicemail. 
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
## 2022-07-30 ENCOUNTER — Other Ambulatory Visit: Payer: Self-pay

## 2022-07-30 ENCOUNTER — Inpatient Hospital Stay (HOSPITAL_BASED_OUTPATIENT_CLINIC_OR_DEPARTMENT_OTHER): Payer: Medicare Other | Admitting: Oncology

## 2022-07-30 ENCOUNTER — Encounter: Payer: Self-pay | Admitting: Oncology

## 2022-07-30 ENCOUNTER — Inpatient Hospital Stay: Payer: Medicare Other | Attending: Oncology

## 2022-07-30 ENCOUNTER — Other Ambulatory Visit: Payer: Self-pay | Admitting: *Deleted

## 2022-07-30 VITALS — BP 129/82 | HR 50 | Temp 97.7°F | Resp 15 | Ht 68.0 in | Wt 175.5 lb

## 2022-07-30 DIAGNOSIS — Z86718 Personal history of other venous thrombosis and embolism: Secondary | ICD-10-CM | POA: Insufficient documentation

## 2022-07-30 DIAGNOSIS — Z79899 Other long term (current) drug therapy: Secondary | ICD-10-CM | POA: Diagnosis not present

## 2022-07-30 DIAGNOSIS — Z86711 Personal history of pulmonary embolism: Secondary | ICD-10-CM | POA: Diagnosis not present

## 2022-07-30 DIAGNOSIS — D6852 Prothrombin gene mutation: Secondary | ICD-10-CM | POA: Insufficient documentation

## 2022-07-30 DIAGNOSIS — I82412 Acute embolism and thrombosis of left femoral vein: Secondary | ICD-10-CM | POA: Diagnosis not present

## 2022-07-30 DIAGNOSIS — I2699 Other pulmonary embolism without acute cor pulmonale: Secondary | ICD-10-CM

## 2022-07-30 DIAGNOSIS — Z7901 Long term (current) use of anticoagulants: Secondary | ICD-10-CM

## 2022-07-30 LAB — CBC WITH DIFFERENTIAL (CANCER CENTER ONLY)
Abs Immature Granulocytes: 0 10*3/uL (ref 0.00–0.07)
Basophils Absolute: 0 10*3/uL (ref 0.0–0.1)
Basophils Relative: 1 %
Eosinophils Absolute: 0.1 10*3/uL (ref 0.0–0.5)
Eosinophils Relative: 2 %
HCT: 40.6 % (ref 39.0–52.0)
Hemoglobin: 14.1 g/dL (ref 13.0–17.0)
Immature Granulocytes: 0 %
Lymphocytes Relative: 33 %
Lymphs Abs: 1.3 10*3/uL (ref 0.7–4.0)
MCH: 32.7 pg (ref 26.0–34.0)
MCHC: 34.7 g/dL (ref 30.0–36.0)
MCV: 94.2 fL (ref 80.0–100.0)
Monocytes Absolute: 0.4 10*3/uL (ref 0.1–1.0)
Monocytes Relative: 11 %
Neutro Abs: 2 10*3/uL (ref 1.7–7.7)
Neutrophils Relative %: 53 %
Platelet Count: 201 10*3/uL (ref 150–400)
RBC: 4.31 MIL/uL (ref 4.22–5.81)
RDW: 13.4 % (ref 11.5–15.5)
WBC Count: 3.8 10*3/uL — ABNORMAL LOW (ref 4.0–10.5)
nRBC: 0 % (ref 0.0–0.2)

## 2022-07-30 LAB — CMP (CANCER CENTER ONLY)
ALT: 23 U/L (ref 0–44)
AST: 28 U/L (ref 15–41)
Albumin: 3.9 g/dL (ref 3.5–5.0)
Alkaline Phosphatase: 37 U/L — ABNORMAL LOW (ref 38–126)
Anion gap: 5 (ref 5–15)
BUN: 24 mg/dL — ABNORMAL HIGH (ref 8–23)
CO2: 25 mmol/L (ref 22–32)
Calcium: 8.9 mg/dL (ref 8.9–10.3)
Chloride: 108 mmol/L (ref 98–111)
Creatinine: 1.06 mg/dL (ref 0.61–1.24)
GFR, Estimated: >60 mL/min (ref >60)
Glucose, Bld: 118 mg/dL — ABNORMAL HIGH (ref 70–99)
Potassium: 4.2 mmol/L (ref 3.5–5.1)
Sodium: 138 mmol/L (ref 135–145)
Total Bilirubin: 0.8 mg/dL (ref 0.3–1.2)
Total Protein: 6.5 g/dL (ref 6.5–8.1)

## 2022-07-30 LAB — D-DIMER, QUANTITATIVE: D-Dimer, Quant: <0.27 ug/mL-FEU (ref 0.00–0.50)

## 2022-07-30 MED ORDER — RIVAROXABAN 15 MG PO TABS: 15.0000 mg | ORAL_TABLET | Freq: Every day | ORAL | 1 refills | Status: DC

## 2022-08-06 ENCOUNTER — Other Ambulatory Visit: Payer: BC Managed Care – PPO

## 2022-08-06 ENCOUNTER — Ambulatory Visit: Payer: BC Managed Care – PPO | Admitting: Oncology

## 2022-10-28 ENCOUNTER — Other Ambulatory Visit: Payer: Self-pay | Admitting: Oncology

## 2022-10-28 DIAGNOSIS — I2699 Other pulmonary embolism without acute cor pulmonale: Secondary | ICD-10-CM
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
## 2022-10-29 ENCOUNTER — Inpatient Hospital Stay: Payer: Medicare Other | Attending: Oncology

## 2022-10-29 ENCOUNTER — Inpatient Hospital Stay (HOSPITAL_BASED_OUTPATIENT_CLINIC_OR_DEPARTMENT_OTHER): Payer: Medicare Other | Admitting: Oncology

## 2022-10-29 VITALS — BP 127/69 | HR 52 | Temp 98.1°F | Resp 16 | Wt 176.3 lb

## 2022-10-29 DIAGNOSIS — Z86718 Personal history of other venous thrombosis and embolism: Secondary | ICD-10-CM | POA: Diagnosis present

## 2022-10-29 DIAGNOSIS — I2699 Other pulmonary embolism without acute cor pulmonale: Secondary | ICD-10-CM

## 2022-10-29 DIAGNOSIS — I82412 Acute embolism and thrombosis of left femoral vein: Secondary | ICD-10-CM

## 2022-10-29 DIAGNOSIS — D6852 Prothrombin gene mutation: Secondary | ICD-10-CM

## 2022-10-29 DIAGNOSIS — Z7901 Long term (current) use of anticoagulants: Secondary | ICD-10-CM | POA: Diagnosis not present

## 2022-10-29 DIAGNOSIS — Z86711 Personal history of pulmonary embolism: Secondary | ICD-10-CM | POA: Diagnosis present

## 2022-10-29 LAB — CMP (CANCER CENTER ONLY)
ALT: 21 U/L (ref 0–44)
AST: 21 U/L (ref 15–41)
Albumin: 3.8 g/dL (ref 3.5–5.0)
Alkaline Phosphatase: 34 U/L — ABNORMAL LOW (ref 38–126)
Anion gap: 6 (ref 5–15)
BUN: 22 mg/dL (ref 8–23)
CO2: 25 mmol/L (ref 22–32)
Calcium: 9.1 mg/dL (ref 8.9–10.3)
Chloride: 106 mmol/L (ref 98–111)
Creatinine: 1.01 mg/dL (ref 0.61–1.24)
GFR, Estimated: >60 mL/min (ref >60)
Glucose, Bld: 96 mg/dL (ref 70–99)
Potassium: 4.2 mmol/L (ref 3.5–5.1)
Sodium: 137 mmol/L (ref 135–145)
Total Bilirubin: 0.7 mg/dL (ref 0.3–1.2)
Total Protein: 6.8 g/dL (ref 6.5–8.1)

## 2022-10-29 LAB — CBC WITH DIFFERENTIAL (CANCER CENTER ONLY)
Abs Immature Granulocytes: 0 10*3/uL (ref 0.00–0.07)
Basophils Absolute: 0 10*3/uL (ref 0.0–0.1)
Basophils Relative: 1 %
Eosinophils Absolute: 0.1 10*3/uL (ref 0.0–0.5)
Eosinophils Relative: 2 %
HCT: 43.3 % (ref 39.0–52.0)
Hemoglobin: 14.6 g/dL (ref 13.0–17.0)
Immature Granulocytes: 0 %
Lymphocytes Relative: 33 %
Lymphs Abs: 1.3 10*3/uL (ref 0.7–4.0)
MCH: 32.4 pg (ref 26.0–34.0)
MCHC: 33.7 g/dL (ref 30.0–36.0)
MCV: 96 fL (ref 80.0–100.0)
Monocytes Absolute: 0.4 10*3/uL (ref 0.1–1.0)
Monocytes Relative: 11 %
Neutro Abs: 2 10*3/uL (ref 1.7–7.7)
Neutrophils Relative %: 53 %
Platelet Count: 189 10*3/uL (ref 150–400)
RBC: 4.51 MIL/uL (ref 4.22–5.81)
RDW: 13.4 % (ref 11.5–15.5)
WBC Count: 3.8 10*3/uL — ABNORMAL LOW (ref 4.0–10.5)
nRBC: 0 % (ref 0.0–0.2)

## 2022-10-29 LAB — D-DIMER, QUANTITATIVE: D-Dimer, Quant: <0.27 ug/mL-FEU (ref 0.00–0.50)

## 2022-10-29 MED ORDER — RIVAROXABAN 10 MG PO TABS: 10.0000 mg | ORAL_TABLET | Freq: Every day | ORAL | 3 refills | Status: DC

## 2023-01-19 ENCOUNTER — Telehealth: Payer: Self-pay | Admitting: Oncology

## 2023-01-19 NOTE — Telephone Encounter (Signed)
called per provider transition. Patient chose to follow MD to [facility] location. Message sent to  [facility] schedulers to get patient rescheduled with them.

## 2023-01-24 ENCOUNTER — Telehealth: Payer: Self-pay

## 2023-01-24 ENCOUNTER — Other Ambulatory Visit: Payer: Self-pay

## 2023-01-24 DIAGNOSIS — I2699 Other pulmonary embolism without acute cor pulmonale: Secondary | ICD-10-CM

## 2023-01-24 DIAGNOSIS — I82412 Acute embolism and thrombosis of left femoral vein: Secondary | ICD-10-CM

## 2023-01-24 NOTE — Telephone Encounter (Signed)
-----   Message from Nurse Marylene Land E sent at 01/20/2023 12:15 PM EDT ----- Regarding: FW: Transfer to [facility] w/ Angelene Giovanni  ----- Message ----- From: Loni Muse, MD Sent: 01/20/2023   9:48 AM EDT To: Jeannette Corpus, LPN Subject: FW: Transfer to [facility]           He would need a CBC with diff, D dimer, BMP Thanks ----- Message ----- From: Arville Care, CMA Sent: 01/19/2023   4:18 PM EDT To: Otilio Miu; Loni Muse, MD Subject: RE: Transfer to [facility] w/ Angelene Giovanni            ----- Message ----- From: Otilio Miu Sent: 01/19/2023   3:48 PM EDT To: Arville Care, CMA Subject: RE: Transfer to [facility]           Patient has requested if possible to do labs a few days before. Wishes for [place] to provide an order for him to either go to [facility] or [facility], which he would schedule the appt for. ----- Message ----- From: Vick Frees Sent: 01/19/2023  10:44 AM EDT To: Otilio Miu; Thomasena Edis Subject: Transfer to [facility] w/ [facility]               Hello again!  This patient was also seeing Dr. Angelene Giovanni here and would like to continue to see him there. He was schedule to see him 9/27. Please reach out to him to get him scheduled for next week. Thank you!   Thank you!  Erie Noe

## 2023-01-24 NOTE — Telephone Encounter (Signed)
Labs ordered and faxed to [facility] on [address] in [place].

## 2023-01-28 ENCOUNTER — Inpatient Hospital Stay: Payer: Medicare Other | Attending: Oncology | Admitting: Oncology

## 2023-01-28 ENCOUNTER — Inpatient Hospital Stay: Payer: Medicare Other

## 2023-01-28 ENCOUNTER — Inpatient Hospital Stay: Payer: Medicare Other | Admitting: Oncology

## 2023-01-28 DIAGNOSIS — I82412 Acute embolism and thrombosis of left femoral vein: Secondary | ICD-10-CM | POA: Diagnosis not present

## 2023-01-28 DIAGNOSIS — Z7901 Long term (current) use of anticoagulants: Secondary | ICD-10-CM | POA: Diagnosis not present

## 2023-01-28 DIAGNOSIS — I2699 Other pulmonary embolism without acute cor pulmonale: Secondary | ICD-10-CM

## 2023-01-28 MED ORDER — RIVAROXABAN 10 MG PO TABS: 10.0000 mg | ORAL_TABLET | Freq: Every day | ORAL | 1 refills | Status: DC
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
## 2023-03-14 LAB — CBC WITH DIFFERENTIAL/PLATELET: EGFR: 73

## 2023-05-06 ENCOUNTER — Encounter (INDEPENDENT_AMBULATORY_CARE_PROVIDER_SITE_OTHER): Payer: Medicare Other | Admitting: Ophthalmology

## 2023-05-06 DIAGNOSIS — H43811 Vitreous degeneration, right eye: Secondary | ICD-10-CM

## 2023-05-06 DIAGNOSIS — I10 Essential (primary) hypertension: Secondary | ICD-10-CM

## 2023-05-06 DIAGNOSIS — H35033 Hypertensive retinopathy, bilateral: Secondary | ICD-10-CM

## 2023-05-06 DIAGNOSIS — H25813 Combined forms of age-related cataract, bilateral: Secondary | ICD-10-CM

## 2023-05-27 ENCOUNTER — Ambulatory Visit (INDEPENDENT_AMBULATORY_CARE_PROVIDER_SITE_OTHER): Payer: Medicare Other | Admitting: Ophthalmology

## 2023-05-27 ENCOUNTER — Encounter (INDEPENDENT_AMBULATORY_CARE_PROVIDER_SITE_OTHER): Payer: Self-pay | Admitting: Ophthalmology

## 2023-05-27 DIAGNOSIS — H3561 Retinal hemorrhage, right eye: Secondary | ICD-10-CM | POA: Diagnosis not present

## 2023-05-27 DIAGNOSIS — H43811 Vitreous degeneration, right eye: Secondary | ICD-10-CM

## 2023-05-27 DIAGNOSIS — H25813 Combined forms of age-related cataract, bilateral: Secondary | ICD-10-CM

## 2023-05-27 DIAGNOSIS — I10 Essential (primary) hypertension: Secondary | ICD-10-CM

## 2023-05-27 DIAGNOSIS — H35033 Hypertensive retinopathy, bilateral: Secondary | ICD-10-CM

## 2023-05-27 DIAGNOSIS — H3581 Retinal edema: Secondary | ICD-10-CM

## 2023-05-27 NOTE — Progress Notes (Signed)
[facility] - Clinic Note  05/27/2023     CHIEF COMPLAINT Patient presents for Retina Follow Up   HISTORY OF PRESENT ILLNESS: Kyle Reese is a 68 y.o. male who presents to the clinic today for:   HPI     Retina Follow Up   Patient presents with  Other.  In right eye.  Severity is moderate.  Duration of 3 weeks.  Since onset it is stable.  I, the attending physician,  performed the HPI with the patient and updated documentation appropriately.        Comments   Pt ret eval referred over by Dr. Dione Booze for RAM OD. Pt states he went in for a cataract eval and was told he had an aneurysm OD. Amblyopic OS. No VA changes noticed. Pt does typically wear a CTL OS +4.00, monovision. Pt did not wear ctl today. Pt is on Xarelto 10mg  daily for DVT/pul embolism. Has hx HTN, takes BP meds.       Last edited by Rennis Chris, MD on 05/27/2023 12:35 PM.     Patient feels the vision is doing well.    Referring physician: Alma Downs, PA-C   HISTORICAL INFORMATION:   Selected notes from the MEDICAL RECORD NUMBER Referred by Alma Downs, PA-C for concern of PVD OD LEE: 01.06.23 Ocular Hx- PVD OD    CURRENT MEDICATIONS: No current outpatient medications on file. (Ophthalmic Drugs)   No current facility-administered medications for this visit. (Ophthalmic Drugs)   Current Outpatient Medications (Other)  Medication Sig   Coenzyme Q10 (COQ-10 PO) Take 1 capsule by mouth daily.   ezetimibe (ZETIA) 10 MG tablet Take 5 mg by mouth daily.   fish oil-omega-3 fatty acids 1000 MG capsule Take 1 g by mouth daily.   rivaroxaban (XARELTO) 10 MG TABS tablet Take 1 tablet (10 mg total) by mouth daily.   valsartan (DIOVAN) 80 MG tablet Take 80 mg by mouth daily.   No current facility-administered medications for this visit. (Other)   REVIEW OF SYSTEMS: ROS   Positive for: Musculoskeletal, Cardiovascular, Heme/Lymph Negative for: Constitutional, Gastrointestinal,  Neurological, Skin, Genitourinary, HENT, Endocrine, Eyes, Respiratory, Psychiatric, Allergic/Imm Last edited by Thompson Grayer, COT on 05/27/2023  8:18 AM.     ALLERGIES No Known Allergies  PAST MEDICAL HISTORY Past Medical History:  Diagnosis Date   Amblyopia    Closed fracture of left olecranon process 11/23/2014   Colon polyps    Deep vein thrombosis (HCC)    Dysphagia    Hemorrhoids    Hypertension    Nondisplaced fracture of lateral end of left clavicle 11/23/2014   Past Surgical History:  Procedure Laterality Date   disc implant     ESOPHAGOGASTRODUODENOSCOPY  04/22/2011   Procedure: ESOPHAGOGASTRODUODENOSCOPY (EGD);  Surgeon: Charolett Bumpers, MD;  Location: [facility];  Service: Endoscopy;  Laterality: N/A;   ORIF ULNAR FRACTURE Left 11/24/2014   Procedure: OPEN REDUCTION INTERNAL FIXATION (ORIF) OLECRANON;  Surgeon: Teryl Lucy, MD;  Location: [facility] OR;  Service: Orthopedics;  Laterality: Left;   WRIST FRACTURE SURGERY  1990    FAMILY HISTORY History reviewed. No pertinent family history.  SOCIAL HISTORY Social History   Tobacco Use   Smoking status: Never   Smokeless tobacco: Never  Substance Use Topics   Alcohol use: Yes   Drug use: No       OPHTHALMIC EXAM:  Base Eye Exam     Visual Acuity (Snellen - Linear)  Right Left   Dist [place] 20/20 20/150   Dist ph [place]  20/50 +2         Tonometry (Tonopen, 8:24 AM)       Right Left   Pressure 12 12         Pupils       Dark Light Shape React APD   Right 3 2 Round Brisk None   Left 3 2 Round Brisk None         Visual Fields (Counting fingers)       Left Right    Full Full         Extraocular Movement       Right Left    Full, Ortho Full, Ortho         Neuro/Psych     Oriented x3: Yes   Mood/Affect: Normal         Dilation     Both eyes: 1.0% Mydriacyl, 2.5% Phenylephrine @ 8:25 AM           Slit Lamp and Fundus Exam     External Exam       Right Left    External Normal Normal         Slit Lamp Exam       Right Left   Lids/Lashes Dermatochalasis - upper lid Dermatochalasis - upper lid   Conjunctiva/Sclera White and quiet White and quiet   Cornea Clear Trace Punctate epithelial erosions   Anterior Chamber Deep and clear Deep and clear   Iris Round and dilated Round and dilated   Lens 2+ Nuclear sclerosis, 2-3+ Cortical cataract 2-3+ Nuclear sclerosis, 2-3+ Cortical cataract   Anterior Vitreous Vitreous syneresis, no pigment, PVD with mild vitreous condensations, Posterior vitreous detachment Vitreous syneresis, no pigment         Fundus Exam       Right Left   Disc Pink and Sharp, temporal PPP, Compact Pink and Sharp, temporal PPA   C/D Ratio 0.2 0.2   Macula Flat, Good foveal reflex, No heme or edema, RPE mottling Flat, Good foveal reflex, No heme or edema, RPE mottling   Vessels mild tortuousity mild tortuousity   Periphery Attached, No RT/RD, faint IRH inferior periphery 0630-- appears to be fading Attached, No heme           Refraction     Manifest Refraction       Sphere Cylinder Dist VA   Right      Left +4.00 Sphere 20/20-2            IMAGING AND PROCEDURES  Imaging and Procedures for 05/27/2023  OCT, Retina - OU - Both Eyes       Right Eye Quality was good. Central Foveal Thickness: 274. Progression has been stable. Findings include normal foveal contour, no IRF, no SRF, myopic contour.   Left Eye Quality was good. Central Foveal Thickness: 286. Progression has been stable. Findings include normal foveal contour, no IRF, no SRF, myopic contour, vitreomacular adhesion .   Notes *Images captured and stored on drive  Diagnosis / Impression:  NFP; no IRF/SRF OU  Clinical management:  See below  Abbreviations: NFP - Normal foveal profile. CME - cystoid macular edema. PED - pigment epithelial detachment. IRF - intraretinal fluid. SRF - subretinal fluid. EZ - ellipsoid zone. ERM - epiretinal  membrane. ORA - outer retinal atrophy. ORT - outer retinal tubulation. SRHM - subretinal hyper-reflective material. IRHM - intraretinal hyper-reflective material  ASSESSMENT/PLAN:    ICD-10-CM   1. Retinal hemorrhage of right eye  H35.61 OCT, Retina - OU - Both Eyes    2. Posterior vitreous detachment of right eye  H43.811     3. Essential hypertension  I10     4. Hypertensive retinopathy of both eyes  H35.033 OCT, Retina - OU - Both Eyes    5. Combined forms of age-related cataract of both eyes  H25.813      Retinal Hemorrhage, OD  - faint IRH inferior periphery 0630-- appears to be fading  - could be resolving RAM  - discussed finding, prognosis  - unclear etiology but risk factors include HTN, h/o Prothrombin gene mutation and DVT/PE on Xarelto (10 mg)  - vision unaffected -- BCVA 20/20 OD - no retinal or ophthalmic interventions indicated or recommended   - f/u 6 weeks, DFE, OCT  2. PVD / vitreous syneresis OD  - acute onset 01.03.23 w/ symptomatic flashes/floaters  - flashes have ceased, floaters improved  - Discussed findings and prognosis  - No RT or RD on 360 scleral depressed exam  - Reviewed s/s of RT/RD  - Strict return precautions for any such RT/RD signs/symptoms  3,4. Hypertensive retinopathy OU - discussed importance of tight BP control - monitor   5. Mixed Cataract OU - The symptoms of cataract, surgical options, and treatments and risks were discussed with patient. - discussed diagnosis and progression - under the expert management of Dr. Dione Booze  Ophthalmic Meds Ordered this visit:  No orders of the defined types were placed in this encounter.    Return in about 6 weeks (around 07/08/2023) for f/u retinal heme OD , DFE, OCT.  There are no Patient Instructions on file for this visit.   Explained the diagnoses, plan, and follow up with the patient and they expressed understanding.  Patient expressed understanding of the importance of  proper follow up care.   This document serves as a record of services personally performed by Karie Chimera, MD, PhD. It was created on their behalf by Charlette Caffey, COT an ophthalmic technician. The creation of this record is the provider's dictation and/or activities during the visit.    Electronically signed by:  Charlette Caffey, COT  05/27/23 4:03 PM  Karie Chimera, M.D., Ph.D. Diseases & Surgery of the Retina and Vitreous [facility]  I have reviewed the above documentation for accuracy and completeness, and I agree with the above. Karie Chimera, M.D., Ph.D. 05/27/23 4:03 PM   Abbreviations: M myopia (nearsighted); A astigmatism; H hyperopia (farsighted); P presbyopia; Mrx spectacle prescription;  CTL contact lenses; OD right eye; OS left eye; OU both eyes  XT exotropia; ET esotropia; PEK punctate epithelial keratitis; PEE punctate epithelial erosions; DES dry eye syndrome; MGD meibomian gland dysfunction; ATs artificial tears; PFAT's preservative free artificial tears; NSC nuclear sclerotic cataract; PSC posterior subcapsular cataract; ERM epi-retinal membrane; PVD posterior vitreous detachment; RD retinal detachment; DM diabetes mellitus; DR diabetic retinopathy; NPDR non-proliferative diabetic retinopathy; PDR proliferative diabetic retinopathy; CSME clinically significant macular edema; DME diabetic macular edema; dbh dot blot hemorrhages; CWS cotton wool spot; POAG primary open angle glaucoma; C/D cup-to-disc ratio; HVF humphrey visual field; GVF goldmann visual field; OCT optical coherence tomography; IOP intraocular pressure; BRVO Branch retinal vein occlusion; CRVO central retinal vein occlusion; CRAO central retinal artery occlusion; BRAO branch retinal artery occlusion; RT retinal tear; SB scleral buckle; PPV pars plana vitrectomy; VH Vitreous hemorrhage; PRP panretinal  laser photocoagulation; IVK intravitreal kenalog; VMT vitreomacular traction; MH  Macular hole;  NVD neovascularization of the disc; NVE neovascularization elsewhere; AREDS age related eye disease study; ARMD age related macular degeneration; POAG primary open angle glaucoma; EBMD epithelial/anterior basement membrane dystrophy; ACIOL anterior chamber intraocular lens; IOL intraocular lens; PCIOL posterior chamber intraocular lens; Phaco/IOL phacoemulsification with intraocular lens placement; PRK photorefractive keratectomy; LASIK laser assisted in situ keratomileusis; HTN hypertension; DM diabetes mellitus; COPD chronic obstructive pulmonary disease

## 2023-07-06 NOTE — Progress Notes (Signed)
 [facility] - Clinic Note  07/15/2023     CHIEF COMPLAINT Patient presents for Retina Follow Up   HISTORY OF PRESENT ILLNESS: Kyle Reese is a 68 y.o. male who presents to the clinic today for:   HPI     Retina Follow Up   Patient presents with  Other.  In right eye.  Severity is moderate.  Duration of 6 weeks.  Since onset it is stable.  I, the attending physician,  performed the HPI with the patient and updated documentation appropriately.        Comments   Patient feels the vision is the same. He is not using eye drops at this time.       Last edited by Rennis Chris, MD on 07/17/2023  1:58 AM.    Patient states vision is the same  Referring physician: Alma Downs, PA-C   HISTORICAL INFORMATION:   Selected notes from the MEDICAL RECORD NUMBER Referred by Alma Downs, PA-C for concern of PVD OD LEE: 01.06.23 Ocular Hx- PVD OD    CURRENT MEDICATIONS: No current outpatient medications on file. (Ophthalmic Drugs)   No current facility-administered medications for this visit. (Ophthalmic Drugs)   Current Outpatient Medications (Other)  Medication Sig   Coenzyme Q10 (COQ-10 PO) Take 1 capsule by mouth daily.   ezetimibe (ZETIA) 10 MG tablet Take 5 mg by mouth daily.   fish oil-omega-3 fatty acids 1000 MG capsule Take 1 g by mouth daily.   rivaroxaban (XARELTO) 10 MG TABS tablet Take 1 tablet (10 mg total) by mouth daily.   valsartan (DIOVAN) 80 MG tablet Take 80 mg by mouth daily.   No current facility-administered medications for this visit. (Other)   REVIEW OF SYSTEMS: ROS   Positive for: Musculoskeletal, Cardiovascular, Heme/Lymph Negative for: Constitutional, Gastrointestinal, Neurological, Skin, Genitourinary, HENT, Endocrine, Eyes, Respiratory, Psychiatric, Allergic/Imm Last edited by Charlette Caffey, COT on 07/15/2023  8:28 AM.     ALLERGIES No Known Allergies  PAST MEDICAL HISTORY Past Medical History:   Diagnosis Date   Amblyopia    Closed fracture of left olecranon process 11/23/2014   Colon polyps    Deep vein thrombosis (HCC)    Dysphagia    Hemorrhoids    Hypertension    Nondisplaced fracture of lateral end of left clavicle 11/23/2014   Past Surgical History:  Procedure Laterality Date   disc implant     ESOPHAGOGASTRODUODENOSCOPY  04/22/2011   Procedure: ESOPHAGOGASTRODUODENOSCOPY (EGD);  Surgeon: Charolett Bumpers, MD;  Location: [facility];  Service: Endoscopy;  Laterality: N/A;   ORIF ULNAR FRACTURE Left 11/24/2014   Procedure: OPEN REDUCTION INTERNAL FIXATION (ORIF) OLECRANON;  Surgeon: Teryl Lucy, MD;  Location: [facility];  Service: Orthopedics;  Laterality: Left;   WRIST FRACTURE SURGERY  1990    FAMILY HISTORY History reviewed. No pertinent family history.  SOCIAL HISTORY Social History   Tobacco Use   Smoking status: Never   Smokeless tobacco: Never  Substance Use Topics   Alcohol use: Yes   Drug use: No       OPHTHALMIC EXAM:  Base Eye Exam     Visual Acuity (Snellen - Linear)       Right Left   Dist [place] 20/20 20/100   Dist ph [place]  20/50         Tonometry (Tonopen, 8:31 AM)       Right Left   Pressure 13 15         Pupils  Dark Light Shape React APD   Right 3 2 Round Brisk None   Left 3 2 Round Brisk None         Visual Fields       Left Right    Full Full         Extraocular Movement       Right Left    Full, Ortho Full, Ortho         Neuro/Psych     Oriented x3: Yes   Mood/Affect: Normal         Dilation     Both eyes: 1.0% Mydriacyl, 2.5% Phenylephrine @ 8:28 AM           Slit Lamp and Fundus Exam     External Exam       Right Left   External Normal Normal         Slit Lamp Exam       Right Left   Lids/Lashes Dermatochalasis - upper lid Dermatochalasis - upper lid   Conjunctiva/Sclera White and quiet White and quiet   Cornea Clear Trace Punctate epithelial erosions   Anterior  Chamber Deep and clear Deep and clear   Iris Round and dilated Round and dilated   Lens 2+ Nuclear sclerosis, 2-3+ Cortical cataract 2-3+ Nuclear sclerosis, 2-3+ Cortical cataract   Anterior Vitreous Vitreous syneresis, no pigment, PVD with mild vitreous condensations, Posterior vitreous detachment Vitreous syneresis, no pigment         Fundus Exam       Right Left   Disc Pink and Sharp, temporal PPP, Compact Pink and Sharp, temporal PPA   C/D Ratio 0.2 0.2   Macula Flat, Good foveal reflex, No heme or edema, RPE mottling Flat, Good foveal reflex, No heme or edema, RPE mottling   Vessels mild attenuation, mild tortuosity mild tortuosity   Periphery Attached, faint IRH inferior periphery 0630 -- appears to be fading -- improved, No RT/RD or new heme Attached, No heme            IMAGING AND PROCEDURES  Imaging and Procedures for 07/15/2023  OCT, Retina - OU - Both Eyes       Right Eye Quality was good. Central Foveal Thickness: 280. Progression has been stable. Findings include normal foveal contour, no IRF, no SRF, myopic contour.   Left Eye Quality was good. Central Foveal Thickness: 290. Progression has been stable. Findings include normal foveal contour, no IRF, no SRF, myopic contour, vitreomacular adhesion .   Notes *Images captured and stored on drive  Diagnosis / Impression:  NFP; no IRF/SRF OU  Clinical management:  See below  Abbreviations: NFP - Normal foveal profile. CME - cystoid macular edema. PED - pigment epithelial detachment. IRF - intraretinal fluid. SRF - subretinal fluid. EZ - ellipsoid zone. ERM - epiretinal membrane. ORA - outer retinal atrophy. ORT - outer retinal tubulation. SRHM - subretinal hyper-reflective material. IRHM - intraretinal hyper-reflective material            ASSESSMENT/PLAN:    ICD-10-CM   1. Retinal hemorrhage of right eye  H35.61 OCT, Retina - OU - Both Eyes    2. Posterior vitreous detachment of right eye  H43.811 OCT,  Retina - OU - Both Eyes    3. Essential hypertension  I10     4. Hypertensive retinopathy of both eyes  H35.033 OCT, Retina - OU - Both Eyes    5. Combined forms of age-related cataract of both eyes  H25.813  Retinal Hemorrhage, OD  - faint IRH inferior periphery 0630-- essentially resolved  - could be resolved RAM  - discussed finding, prognosis  - unclear etiology but risk factors include HTN, h/o Prothrombin gene mutation and DVT/PE on Xarelto (10 mg)  - vision unaffected -- BCVA 20/20 OD - no retinal or ophthalmic interventions indicated or recommended   - pt is cleared from a retina standpoint for release to Dr. Dione Booze and resumption of primary eye care  2. PVD / vitreous syneresis OD  - acute onset 01.03.23 w/ symptomatic flashes/floaters  - flashes have ceased, floaters improved  - Discussed findings and prognosis  - No RT or RD on 360 scleral depressed exam  - Reviewed s/s of RT/RD  - Strict return precautions for any such RT/RD signs/symptoms  3,4. Hypertensive retinopathy OU - discussed importance of tight BP control - monitor   5. Mixed Cataract OU - The symptoms of cataract, surgical options, and treatments and risks were discussed with patient. - discussed diagnosis and progression - under the expert management of Dr. Dione Booze  Ophthalmic Meds Ordered this visit:  No orders of the defined types were placed in this encounter.    Return if symptoms worsen or fail to improve.  There are no Patient Instructions on file for this visit.   This document serves as a record of services personally performed by Karie Chimera, MD, PhD. It was created on their behalf by Berlin Hun COT, an ophthalmic technician. The creation of this record is the provider's dictation and/or activities during the visit.    Electronically signed by: Berlin Hun COT 03.05.25 2:01 AM  This document serves as a record of services personally performed by Karie Chimera,  MD, PhD. It was created on their behalf by Glee Arvin. Manson Passey, OA an ophthalmic technician. The creation of this record is the provider's dictation and/or activities during the visit.    Electronically signed by: Glee Arvin. Manson Passey, OA 07/17/23 2:01 AM  Karie Chimera, M.D., Ph.D. Diseases & Surgery of the Retina and Vitreous [facility] 07/15/2023   I have reviewed the above documentation for accuracy and completeness, and I agree with the above. Karie Chimera, M.D., Ph.D. 07/17/23 2:03 AM   Abbreviations: M myopia (nearsighted); A astigmatism; H hyperopia (farsighted); P presbyopia; Mrx spectacle prescription;  CTL contact lenses; OD right eye; OS left eye; OU both eyes  XT exotropia; ET esotropia; PEK punctate epithelial keratitis; PEE punctate epithelial erosions; DES dry eye syndrome; MGD meibomian gland dysfunction; ATs artificial tears; PFAT's preservative free artificial tears; NSC nuclear sclerotic cataract; PSC posterior subcapsular cataract; ERM epi-retinal membrane; PVD posterior vitreous detachment; RD retinal detachment; DM diabetes mellitus; DR diabetic retinopathy; NPDR non-proliferative diabetic retinopathy; PDR proliferative diabetic retinopathy; CSME clinically significant macular edema; DME diabetic macular edema; dbh dot blot hemorrhages; CWS cotton wool spot; POAG primary open angle glaucoma; C/D cup-to-disc ratio; HVF humphrey visual field; GVF goldmann visual field; OCT optical coherence tomography; IOP intraocular pressure; BRVO Branch retinal vein occlusion; CRVO central retinal vein occlusion; CRAO central retinal artery occlusion; BRAO branch retinal artery occlusion; RT retinal tear; SB scleral buckle; PPV pars plana vitrectomy; VH Vitreous hemorrhage; PRP panretinal laser photocoagulation; IVK intravitreal kenalog; VMT vitreomacular traction; MH Macular hole;  NVD neovascularization of the disc; NVE neovascularization elsewhere; AREDS age related eye  disease study; ARMD age related macular degeneration; POAG primary open angle glaucoma; EBMD epithelial/anterior basement membrane dystrophy; ACIOL anterior chamber intraocular lens; IOL intraocular  lens; PCIOL posterior chamber intraocular lens; Phaco/IOL phacoemulsification with intraocular lens placement; PRK photorefractive keratectomy; LASIK laser assisted in situ keratomileusis; HTN hypertension; DM diabetes mellitus; COPD chronic obstructive pulmonary disease

## 2023-07-12 ENCOUNTER — Telehealth: Payer: Self-pay | Admitting: Physician Assistant

## 2023-07-15 ENCOUNTER — Ambulatory Visit (INDEPENDENT_AMBULATORY_CARE_PROVIDER_SITE_OTHER): Payer: Medicare Other | Admitting: Ophthalmology

## 2023-07-15 DIAGNOSIS — H25813 Combined forms of age-related cataract, bilateral: Secondary | ICD-10-CM

## 2023-07-15 DIAGNOSIS — I10 Essential (primary) hypertension: Secondary | ICD-10-CM

## 2023-07-15 DIAGNOSIS — H35033 Hypertensive retinopathy, bilateral: Secondary | ICD-10-CM

## 2023-07-15 DIAGNOSIS — H43811 Vitreous degeneration, right eye: Secondary | ICD-10-CM

## 2023-07-15 DIAGNOSIS — H3561 Retinal hemorrhage, right eye: Secondary | ICD-10-CM | POA: Diagnosis not present

## 2023-07-17 ENCOUNTER — Encounter (INDEPENDENT_AMBULATORY_CARE_PROVIDER_SITE_OTHER): Payer: Self-pay | Admitting: Ophthalmology

## 2023-07-21 ENCOUNTER — Other Ambulatory Visit: Payer: Self-pay | Admitting: Physician Assistant

## 2023-07-21 DIAGNOSIS — Z86718 Personal history of other venous thrombosis and embolism: Secondary | ICD-10-CM

## 2023-07-21 DIAGNOSIS — D6852 Prothrombin gene mutation: Secondary | ICD-10-CM

## 2023-07-22 ENCOUNTER — Inpatient Hospital Stay (HOSPITAL_BASED_OUTPATIENT_CLINIC_OR_DEPARTMENT_OTHER): Admitting: Physician Assistant

## 2023-07-22 ENCOUNTER — Inpatient Hospital Stay: Attending: Internal Medicine

## 2023-07-22 VITALS — BP 124/76 | HR 48 | Temp 97.7°F | Resp 18 | Ht 68.0 in | Wt 181.8 lb

## 2023-07-22 DIAGNOSIS — D6852 Prothrombin gene mutation: Secondary | ICD-10-CM | POA: Diagnosis present

## 2023-07-22 DIAGNOSIS — Z86711 Personal history of pulmonary embolism: Secondary | ICD-10-CM | POA: Insufficient documentation

## 2023-07-22 DIAGNOSIS — Z79899 Other long term (current) drug therapy: Secondary | ICD-10-CM | POA: Diagnosis not present

## 2023-07-22 DIAGNOSIS — I82412 Acute embolism and thrombosis of left femoral vein: Secondary | ICD-10-CM

## 2023-07-22 DIAGNOSIS — Z7901 Long term (current) use of anticoagulants: Secondary | ICD-10-CM | POA: Insufficient documentation

## 2023-07-22 DIAGNOSIS — Z86718 Personal history of other venous thrombosis and embolism: Secondary | ICD-10-CM | POA: Insufficient documentation

## 2023-07-22 DIAGNOSIS — I2699 Other pulmonary embolism without acute cor pulmonale: Secondary | ICD-10-CM

## 2023-07-22 LAB — CMP (CANCER CENTER ONLY)
ALT: 20 U/L (ref 0–44)
AST: 23 U/L (ref 15–41)
Albumin: 4.1 g/dL (ref 3.5–5.0)
Alkaline Phosphatase: 37 U/L — ABNORMAL LOW (ref 38–126)
Anion gap: 7 (ref 5–15)
BUN: 26 mg/dL — ABNORMAL HIGH (ref 8–23)
CO2: 26 mmol/L (ref 22–32)
Calcium: 9.2 mg/dL (ref 8.9–10.3)
Chloride: 108 mmol/L (ref 98–111)
Creatinine: 1 mg/dL (ref 0.61–1.24)
GFR, Estimated: >60 mL/min (ref >60)
Glucose, Bld: 80 mg/dL (ref 70–99)
Potassium: 4.3 mmol/L (ref 3.5–5.1)
Sodium: 141 mmol/L (ref 135–145)
Total Bilirubin: 0.7 mg/dL (ref 0.0–1.2)
Total Protein: 6.7 g/dL (ref 6.5–8.1)

## 2023-07-22 LAB — CBC WITH DIFFERENTIAL (CANCER CENTER ONLY)
Abs Immature Granulocytes: 0 10*3/uL (ref 0.00–0.07)
Basophils Absolute: 0.1 10*3/uL (ref 0.0–0.1)
Basophils Relative: 1 %
Eosinophils Absolute: 0.1 10*3/uL (ref 0.0–0.5)
Eosinophils Relative: 2 %
HCT: 41.8 % (ref 39.0–52.0)
Hemoglobin: 14.1 g/dL (ref 13.0–17.0)
Immature Granulocytes: 0 %
Lymphocytes Relative: 33 %
Lymphs Abs: 1.4 10*3/uL (ref 0.7–4.0)
MCH: 31.8 pg (ref 26.0–34.0)
MCHC: 33.7 g/dL (ref 30.0–36.0)
MCV: 94.4 fL (ref 80.0–100.0)
Monocytes Absolute: 0.6 10*3/uL (ref 0.1–1.0)
Monocytes Relative: 13 %
Neutro Abs: 2.2 10*3/uL (ref 1.7–7.7)
Neutrophils Relative %: 51 %
Platelet Count: 190 10*3/uL (ref 150–400)
RBC: 4.43 MIL/uL (ref 4.22–5.81)
RDW: 13.3 % (ref 11.5–15.5)
WBC Count: 4.3 10*3/uL (ref 4.0–10.5)
nRBC: 0 % (ref 0.0–0.2)

## 2023-07-22 LAB — D-DIMER, QUANTITATIVE: D-Dimer, Quant: <0.27 ug{FEU}/mL (ref 0.00–0.50)

## 2023-07-22 MED ORDER — RIVAROXABAN 10 MG PO TABS: 10.0000 mg | ORAL_TABLET | Freq: Every day | ORAL | 1 refills | Status: DC

## 2023-07-22 NOTE — Progress Notes (Signed)
 [facility] [phone]   Fax:([phone]  PROGRESS NOTE  Patient Care Team: Marden Noble, MD (Inactive) as PCP - General (Internal Medicine) Loni Muse, MD (Inactive) as Consulting Physician (Hematology)   CHIEF COMPLAINTS/PURPOSE OF CONSULTATION:  Recurrent DVT and PE  HISTORY OF PRESENTING ILLNESS: 03/16/2018: Diagnosed with  LLE DVT after immobilization of leg for left fibular fracture which was sustained after a fall [place] [facility].  Spent 8 weeks with leg immobilized.  Doppler U/S  showed DVT in LLE. Occlusive thrombus involving the left femoral vein, left popliteal.  vein and probably the left gastrocnemius veins.  Treated with Xarelto x 3 months. 03/2020: DVT right gastrocnemius vein.  Patient has a surrounding bruise.  Patient is an avid cyclist and this was felt to be provoked.  Received Xarelto x 3 months  01/10/2022: Admitted for management of PE.  Had one day history of SOB.  No leg symptoms.  No chest pain.  CTA chest showed ccute bilateral pulmonary emboli with CT evidence of RIGHT heart strain (RV/LV Ratio = 1.03) consistent with at least submassive (intermediate risk) PE.  Right heart strain associated with an increased risk of morbidity and mortality. Cardiomegaly.  Unchanged ascending thoracic aorta measuring 3.9 cm in greatest diameter. Doppler U/S Nonocclusive DVT within the LEFT femoral and popliteal veins.  No RIGHT LOWER extremity DVT. D-dimer 6.20 Factor V Leiden gene mutation testing negative Patient heterozygous for the prothrombin gene mutation Protein C activity 89% Protein S antigen 57% 01/29/2022: Establish care with Dr. Angelene Giovanni 07/22/2023: Transfer care to Dr. Jeanie Sewer and Georga Kaufmann PA-C   INTERVAL HISTORY:  Discussed the use of AI scribe software for clinical note transcription with the patient, who gave verbal consent to proceed. History of Present Illness The patient, with a history of recurrent  deep vein thrombosis (DVT) and pulmonary embolism (PE), is on indefinite Xarelto therapy due to a familial prothrombin gene mutation. He reports an asymptomatic retinal bleed, discovered during a pre-cataract surgery examination. The bleed was deemed non-concerning by a retina specialist and is resolving. The patient denies any other bleeding episodes, such as blood in urine or stool. He is an active cyclist and has adjusted the timing of his Xarelto dose to minimize the risk of bleeding in case of a fall. The patient has not experienced any swelling in the leg or shortness of breath or chest pain since the last visit. The cost of the medication is not an issue for the patient.  He is otherwise doing well without any new or concerning symptoms. He denies fevers, chills, sweats, headaches or dizziness. Rest of the 10 point ROS is below.     MEDICAL HISTORY:  Past Medical History:  Diagnosis Date   Amblyopia    Closed fracture of left olecranon process 11/23/2014   Colon polyps    Deep vein thrombosis (HCC)    Dysphagia    Hemorrhoids    Hypertension    Nondisplaced fracture of lateral end of left clavicle 11/23/2014    SURGICAL HISTORY: Past Surgical History:  Procedure Laterality Date   disc implant     ESOPHAGOGASTRODUODENOSCOPY  04/22/2011   Procedure: ESOPHAGOGASTRODUODENOSCOPY (EGD);  Surgeon: Charolett Bumpers, MD;  Location: [facility];  Service: Endoscopy;  Laterality: N/A;   ORIF ULNAR FRACTURE Left 11/24/2014   Procedure: OPEN REDUCTION INTERNAL FIXATION (ORIF) OLECRANON;  Surgeon: Teryl Lucy, MD;  Location: [facility];  Service: Orthopedics;  Laterality: Left;   WRIST FRACTURE SURGERY  1990  SOCIAL HISTORY: Social History   Socioeconomic History   Marital status: Married    Spouse name: Not on file   Number of children: Not on file   Years of education: Not on file   Highest education level: Not on file  Occupational History   Not on file  Tobacco Use   Smoking  status: Never   Smokeless tobacco: Never  Substance and Sexual Activity   Alcohol use: Yes   Drug use: No   Sexual activity: Not on file  Other Topics Concern   Not on file  Social History Narrative   Not on file   Social Drivers of Health   Financial Resource Strain: Not on file  Food Insecurity: No Food Insecurity (01/11/2022)   Hunger Vital Sign    Worried About Running Out of Food in the Last Year: Never true    Ran Out of Food in the Last Year: Never true  Transportation Needs: No Transportation Needs (01/11/2022)   PRAPARE - Administrator, Civil Service (Medical): No    Lack of Transportation (Non-Medical): No  Physical Activity: Not on file  Stress: Not on file  Social Connections: Unknown (09/13/2021)   Received from [facility]   Social Network    Social Network: Not on file  Intimate Partner Violence: Not At Risk (01/11/2022)   Humiliation, Afraid, Rape, and Kick questionnaire    Fear of Current or Ex-Partner: No    Emotionally Abused: No    Physically Abused: No    Sexually Abused: No    FAMILY HISTORY: No family history on file.  ALLERGIES:  has no known allergies.  MEDICATIONS:  Current Outpatient Medications  Medication Sig Dispense Refill   Coenzyme Q10 (COQ-10 PO) Take 1 capsule by mouth daily.     ezetimibe (ZETIA) 10 MG tablet Take 5 mg by mouth daily.     fish oil-omega-3 fatty acids 1000 MG capsule Take 1 g by mouth daily.     valsartan (DIOVAN) 80 MG tablet Take 80 mg by mouth daily.     rivaroxaban (XARELTO) 10 MG TABS tablet Take 1 tablet (10 mg total) by mouth daily. 90 tablet 1   No current facility-administered medications for this visit.    REVIEW OF SYSTEMS:   Constitutional: ( - ) fevers, ( - )  chills , ( - ) night sweats Eyes: ( - ) blurriness of vision, ( - ) double vision, ( - ) watery eyes Ears, nose, mouth, throat, and face: ( - ) mucositis, ( - ) sore throat Respiratory: ( - ) cough, ( - ) dyspnea, (  - ) wheezes Cardiovascular: ( - ) palpitation, ( - ) chest discomfort, ( - ) lower extremity swelling Gastrointestinal:  ( - ) nausea, ( - ) heartburn, ( - ) change in bowel habits Skin: ( - ) abnormal skin rashes Lymphatics: ( - ) new lymphadenopathy, ( - ) easy bruising Neurological: ( - ) numbness, ( - ) tingling, ( - ) new weaknesses Behavioral/Psych: ( - ) mood change, ( - ) new changes  All other systems were reviewed with the patient and are negative.  PHYSICAL EXAMINATION: ECOG PERFORMANCE STATUS: 0 - Asymptomatic  Vitals:   07/22/23 0900  BP: 124/76  Pulse: (!) 48  Resp: 18  Temp: 97.7 F (36.5 C)  SpO2: 97%   Filed Weights   07/22/23 0900  Weight: 181 lb 12.8 oz (82.5 kg)    GENERAL: well appearing  male in NAD  SKIN: skin color, texture, turgor are normal, no rashes or significant lesions EYES: conjunctiva are pink and non-injected, sclera clear LUNGS: clear to auscultation and percussion with normal breathing effort HEART: regular rate & rhythm and no murmurs and no lower extremity edema Musculoskeletal: no cyanosis of digits and no clubbing  PSYCH: alert & oriented x 3, fluent speech NEURO: no focal motor/sensory deficits  LABORATORY DATA:  I have reviewed the data as listed    Latest Ref Rng & Units 07/22/2023    8:41 AM 10/29/2022    8:43 AM 07/30/2022    8:51 AM  CBC  WBC 4.0 - 10.5 K/uL 4.3  3.8  3.8   Hemoglobin 13.0 - 17.0 g/dL 91.4  78.2  95.6   Hematocrit 39.0 - 52.0 % 41.8  43.3  40.6   Platelets 150 - 400 K/uL 190  189  201        Latest Ref Rng & Units 07/22/2023    8:41 AM 10/29/2022    8:43 AM 07/30/2022    8:51 AM  CMP  Glucose 70 - 99 mg/dL 80  96  213   BUN 8 - 23 mg/dL 26  22  24    Creatinine 0.61 - 1.24 mg/dL 0.86  5.78  4.69   Sodium 135 - 145 mmol/L 141  137  138   Potassium 3.5 - 5.1 mmol/L 4.3  4.2  4.2   Chloride 98 - 111 mmol/L 108  106  108   CO2 22 - 32 mmol/L 26  25  25    Calcium 8.9 - 10.3 mg/dL 9.2  9.1  8.9   Total  Protein 6.5 - 8.1 g/dL 6.7  6.8  6.5   Total Bilirubin 0.0 - 1.2 mg/dL 0.7  0.7  0.8   Alkaline Phos 38 - 126 U/L 37  34  37   AST 15 - 41 U/L 23  21  28    ALT 0 - 44 U/L 20  21  23     RADIOGRAPHIC STUDIES: I have personally reviewed the radiological images as listed and agreed with the findings in the report. OCT, Retina - OU - Both Eyes Result Date: 07/17/2023 Right Eye Quality was good. Central Foveal Thickness: 280. Progression has been stable. Findings include normal foveal contour, no IRF, no SRF, myopic contour. Left Eye Quality was good. Central Foveal Thickness: 290. Progression has been stable. Findings include normal foveal contour, no IRF, no SRF, myopic contour, vitreomacular adhesion . Notes *Images captured and stored on drive Diagnosis / Impression: NFP; no IRF/SRF OU Clinical management: See below Abbreviations: NFP - Normal foveal profile. CME - cystoid macular edema. PED - pigment epithelial detachment. IRF - intraretinal fluid. SRF - subretinal fluid. EZ - ellipsoid zone. ERM - epiretinal membrane. ORA - outer retinal atrophy. ORT - outer retinal tubulation. SRHM - subretinal hyper-reflective material. IRHM - intraretinal hyper-reflective material    Assessment and Plan Assessment & Plan #Prothrombin gene mutation with recurrent venous thromboembolism -Recurrent VTE linked to heterozygous prothrombin gene mutation. Indefinite anticoagulation with Xarelto 10 mg daily. Asymptomatic retinal bleed not concerning. No other bleeding. Normal blood counts. Medication well-tolerated. Continued Xarelto necessary to prevent thromboembolic events. - Continue Xarelto 10 mg daily. Refill sent today.  - Monitor blood counts and chemistry panel regularly. - Advise reporting of new bleeding or thrombosis symptoms. - Schedule follow-up every six months.   No orders of the defined types were placed in this encounter.   All questions  were answered. The patient knows to call the clinic with  any problems, questions or concerns.  I have spent a total of 25 minutes minutes of face-to-face and non-face-to-face time, preparing to see the patient, performing a medically appropriate examination, counseling and educating the patient, ordering medications, documenting clinical information in the electronic health record, independently interpreting results and communicating results to the patient, and care coordination.   Georga Kaufmann, PA-C Department of Hematology/Oncology [facility] Phone: [phone]

## 2023-07-26 ENCOUNTER — Other Ambulatory Visit: Payer: BLUE CROSS/BLUE SHIELD

## 2023-07-26 ENCOUNTER — Telehealth: Payer: BLUE CROSS/BLUE SHIELD | Admitting: Oncology

## 2024-01-16 ENCOUNTER — Inpatient Hospital Stay: Admitting: Hematology and Oncology

## 2024-01-16 ENCOUNTER — Inpatient Hospital Stay: Attending: Physician Assistant

## 2024-01-16 VITALS — BP 122/78 | HR 49 | Temp 97.6°F | Resp 16 | Ht 68.0 in | Wt 175.0 lb

## 2024-01-16 DIAGNOSIS — D6852 Prothrombin gene mutation: Secondary | ICD-10-CM | POA: Diagnosis not present

## 2024-01-16 DIAGNOSIS — Z86711 Personal history of pulmonary embolism: Secondary | ICD-10-CM | POA: Diagnosis not present

## 2024-01-16 DIAGNOSIS — Z79899 Other long term (current) drug therapy: Secondary | ICD-10-CM | POA: Diagnosis not present

## 2024-01-16 DIAGNOSIS — I82412 Acute embolism and thrombosis of left femoral vein: Secondary | ICD-10-CM

## 2024-01-16 DIAGNOSIS — Z7901 Long term (current) use of anticoagulants: Secondary | ICD-10-CM | POA: Diagnosis not present

## 2024-01-16 DIAGNOSIS — Z86718 Personal history of other venous thrombosis and embolism: Secondary | ICD-10-CM | POA: Diagnosis not present

## 2024-01-16 LAB — CBC WITH DIFFERENTIAL (CANCER CENTER ONLY)
Abs Immature Granulocytes: 0.01 K/uL (ref 0.00–0.07)
Basophils Absolute: 0 K/uL (ref 0.0–0.1)
Basophils Relative: 1 %
Eosinophils Absolute: 0.1 K/uL (ref 0.0–0.5)
Eosinophils Relative: 2 %
HCT: 40.5 % (ref 39.0–52.0)
Hemoglobin: 13.6 g/dL (ref 13.0–17.0)
Immature Granulocytes: 0 %
Lymphocytes Relative: 31 %
Lymphs Abs: 1.2 K/uL (ref 0.7–4.0)
MCH: 31.8 pg (ref 26.0–34.0)
MCHC: 33.6 g/dL (ref 30.0–36.0)
MCV: 94.6 fL (ref 80.0–100.0)
Monocytes Absolute: 0.4 K/uL (ref 0.1–1.0)
Monocytes Relative: 10 %
Neutro Abs: 2.2 K/uL (ref 1.7–7.7)
Neutrophils Relative %: 56 %
Platelet Count: 190 K/uL (ref 150–400)
RBC: 4.28 MIL/uL (ref 4.22–5.81)
RDW: 13.5 % (ref 11.5–15.5)
WBC Count: 3.9 K/uL — ABNORMAL LOW (ref 4.0–10.5)
nRBC: 0 % (ref 0.0–0.2)

## 2024-01-16 LAB — CMP (CANCER CENTER ONLY)
ALT: 19 U/L (ref 0–44)
AST: 21 U/L (ref 15–41)
Albumin: 4 g/dL (ref 3.5–5.0)
Alkaline Phosphatase: 38 U/L (ref 38–126)
Anion gap: 3 — ABNORMAL LOW (ref 5–15)
BUN: 24 mg/dL — ABNORMAL HIGH (ref 8–23)
CO2: 30 mmol/L (ref 22–32)
Calcium: 8.9 mg/dL (ref 8.9–10.3)
Chloride: 107 mmol/L (ref 98–111)
Creatinine: 1.02 mg/dL (ref 0.61–1.24)
GFR, Estimated: >60 mL/min (ref >60)
Glucose, Bld: 108 mg/dL — ABNORMAL HIGH (ref 70–99)
Potassium: 4.2 mmol/L (ref 3.5–5.1)
Sodium: 140 mmol/L (ref 135–145)
Total Bilirubin: 0.6 mg/dL (ref 0.0–1.2)
Total Protein: 6.6 g/dL (ref 6.5–8.1)

## 2024-01-16 MED ORDER — RIVAROXABAN 10 MG PO TABS: 10.0000 mg | ORAL_TABLET | Freq: Every day | ORAL | 1 refills | Status: AC

## 2024-01-16 NOTE — Progress Notes (Signed)
 [facility] [phone]   [phone]  PROGRESS NOTE  Patient Care Team: Delice Charleston, MD (Inactive) as PCP - General (Internal Medicine) Bernie Guillermina BROCKS, MD (Inactive) as Consulting Physician (Hematology)   CHIEF COMPLAINTS/PURPOSE OF CONSULTATION:  Recurrent DVT and PE  HISTORY OF PRESENTING ILLNESS: 03/16/2018: Diagnosed with  LLE DVT after immobilization of leg for left fibular fracture which was sustained after a [place] [facility] [place].  Spent 8 weeks with leg immobilized.  Doppler U/S  showed DVT in LLE. Occlusive thrombus involving the left femoral vein, left popliteal.  vein and probably the left gastrocnemius veins.  Treated with Xarelto  x 3 months. 03/2020: DVT right gastrocnemius vein.  Patient has a surrounding bruise.  Patient is an avid cyclist and this was felt to be provoked.  Received Xarelto  x 3 months  01/10/2022: Admitted for management of PE.  Had one day history of SOB.  No leg symptoms.  No chest pain.  CTA chest showed ccute bilateral pulmonary emboli with CT evidence of RIGHT heart strain (RV/LV Ratio = 1.03) consistent with at least submassive (intermediate risk) PE.  Right heart strain associated with an increased risk of morbidity and mortality. Cardiomegaly.  Unchanged ascending thoracic aorta measuring 3.9 cm in greatest diameter. Doppler U/S Nonocclusive DVT within the LEFT femoral and popliteal veins.  No RIGHT LOWER extremity DVT. D-dimer 6.20 Factor V Leiden gene mutation testing negative Patient heterozygous for the prothrombin gene mutation Protein C activity 89% Protein S antigen 57% 01/29/2022: Establish care with Dr. Bernie 07/22/2023: Transfer care to Dr. Norleen Kidney and [employer] PA-C   INTERVAL HISTORY:  On exam today Dr. Kriste reports he has been well overall in the room since her last visit.  He reports his energy and appetite are strong and he is been doing a lot of cycling.  He reports he is  having no problems with the Xarelto .  He did have a little bit of scleral hemorrhaging which did occur after bouts of coughing and sneezing.  He reports it did not look too bad.  He reports he is had no bleeding elsewhere such as nosebleeds, gum bleeding, or dark stools.  He reports that he gets the medication for about $50 per month.  He reports that he was also seen by Dr. Garnette Rous at [facility] for evaluation.  Overall he is willing and able to continue on his maintenance dose of Xarelto  at this time.  He otherwise has had no fevers, chills, sweats, nausea, or diarrhea.  Full 10 point ROS is otherwise negative.   MEDICAL HISTORY:  Past Medical History:  Diagnosis Date   Amblyopia    Closed fracture of left olecranon process 11/23/2014   Colon polyps    Deep vein thrombosis (HCC)    Dysphagia    Hemorrhoids    Hypertension    Nondisplaced fracture of lateral end of left clavicle 11/23/2014    SURGICAL HISTORY: Past Surgical History:  Procedure Laterality Date   disc implant     ESOPHAGOGASTRODUODENOSCOPY  04/22/2011   Procedure: ESOPHAGOGASTRODUODENOSCOPY (EGD);  Surgeon: Gladis MARLA Louder, MD;  Location: [facility];  Service: Endoscopy;  Laterality: N/A;   ORIF ULNAR FRACTURE Left 11/24/2014   Procedure: OPEN REDUCTION INTERNAL FIXATION (ORIF) OLECRANON;  Surgeon: Fonda Olmsted, MD;  Location: [facility];  Service: Orthopedics;  Laterality: Left;   WRIST FRACTURE SURGERY  1990    SOCIAL HISTORY: Social History   Socioeconomic History   Marital status: Married  Spouse name: Not on file   Number of children: Not on file   Years of education: Not on file   Highest education level: Not on file  Occupational History   Not on file  Tobacco Use   Smoking status: Never   Smokeless tobacco: Never  Substance and Sexual Activity   Alcohol use: Yes   Drug use: No   Sexual activity: Not on file  Other Topics Concern   Not on file  Social History Narrative   Not on file   Social  Drivers of Health   Financial Resource Strain: Not on file  Food Insecurity: No Food Insecurity (01/11/2022)   Hunger Vital Sign    Worried About Running Out of Food in the Last Year: Never true    Ran Out of Food in the Last Year: Never true  Transportation Needs: No Transportation Needs (01/11/2022)   PRAPARE - Administrator, Civil Service (Medical): No    Lack of Transportation (Non-Medical): No  Physical Activity: Not on file  Stress: Not on file  Social Connections: Unknown (09/13/2021)   Received from [facility]   Social Network    Social Network: Not on file  Intimate Partner Violence: Not At Risk (01/11/2022)   Humiliation, Afraid, Rape, and Kick questionnaire    Fear of Current or Ex-Partner: No    Emotionally Abused: No    Physically Abused: No    Sexually Abused: No    FAMILY HISTORY: No family history on file.  ALLERGIES:  has no known allergies.  MEDICATIONS:  Current Outpatient Medications  Medication Sig Dispense Refill   Coenzyme Q10 (COQ-10 PO) Take 1 capsule by mouth daily.     ezetimibe  (ZETIA ) 10 MG tablet Take 5 mg by mouth daily.     fish oil-omega-3 fatty acids 1000 MG capsule Take 1 g by mouth daily.     rivaroxaban  (XARELTO ) 10 MG TABS tablet Take 1 tablet (10 mg total) by mouth daily. 90 tablet 1   valsartan (DIOVAN) 80 MG tablet Take 80 mg by mouth daily.     No current facility-administered medications for this visit.    REVIEW OF SYSTEMS:   Constitutional: ( - ) fevers, ( - )  chills , ( - ) night sweats Eyes: ( - ) blurriness of vision, ( - ) double vision, ( - ) watery eyes Ears, nose, mouth, throat, and face: ( - ) mucositis, ( - ) sore throat Respiratory: ( - ) cough, ( - ) dyspnea, ( - ) wheezes Cardiovascular: ( - ) palpitation, ( - ) chest discomfort, ( - ) lower extremity swelling Gastrointestinal:  ( - ) nausea, ( - ) heartburn, ( - ) change in bowel habits Skin: ( - ) abnormal skin rashes Lymphatics: ( - ) new  lymphadenopathy, ( - ) easy bruising Neurological: ( - ) numbness, ( - ) tingling, ( - ) new weaknesses Behavioral/Psych: ( - ) mood change, ( - ) new changes  All other systems were reviewed with the patient and are negative.  PHYSICAL EXAMINATION: ECOG PERFORMANCE STATUS: 0 - Asymptomatic  Vitals:   01/16/24 1009  BP: 122/78  Pulse: (!) 49  Resp: 16  Temp: 97.6 F (36.4 C)  SpO2: 98%    Filed Weights   01/16/24 1009  Weight: 175 lb (79.4 kg)     GENERAL: well appearing male in NAD  SKIN: skin color, texture, turgor are normal, no rashes or significant lesions EYES: conjunctiva  are pink and non-injected, sclera clear LUNGS: clear to auscultation and percussion with normal breathing effort HEART: regular rate & rhythm and no murmurs and no lower extremity edema Musculoskeletal: no cyanosis of digits and no clubbing  PSYCH: alert & oriented x 3, fluent speech NEURO: no focal motor/sensory deficits  LABORATORY DATA:  I have reviewed the data as listed    Latest Ref Rng & Units 01/16/2024    9:50 AM 07/22/2023    8:41 AM 10/29/2022    8:43 AM  CBC  WBC 4.0 - 10.5 K/uL 3.9  4.3  3.8   Hemoglobin 13.0 - 17.0 g/dL 86.3  85.8  85.3   Hematocrit 39.0 - 52.0 % 40.5  41.8  43.3   Platelets 150 - 400 K/uL 190  190  189        Latest Ref Rng & Units 07/22/2023    8:41 AM 10/29/2022    8:43 AM 07/30/2022    8:51 AM  CMP  Glucose 70 - 99 mg/dL 80  96  881   BUN 8 - 23 mg/dL 26  22  24    Creatinine 0.61 - 1.24 mg/dL 8.99  8.98  8.93   Sodium 135 - 145 mmol/L 141  137  138   Potassium 3.5 - 5.1 mmol/L 4.3  4.2  4.2   Chloride 98 - 111 mmol/L 108  106  108   CO2 22 - 32 mmol/L 26  25  25    Calcium 8.9 - 10.3 mg/dL 9.2  9.1  8.9   Total Protein 6.5 - 8.1 g/dL 6.7  6.8  6.5   Total Bilirubin 0.0 - 1.2 mg/dL 0.7  0.7  0.8   Alkaline Phos 38 - 126 U/L 37  34  37   AST 15 - 41 U/L 23  21  28    ALT 0 - 44 U/L 20  21  23     RADIOGRAPHIC STUDIES: No results found.   Assessment  and Plan Dr. Reyes Calender (MD Neurosurgery) is a 68 year old male with medical history significant for recurrent VTE's and heterozygous prothrombin gene mutation who presents for follow-up visit.  # Prothrombin gene mutation with recurrent venous thromboembolism -Recurrent VTE linked to heterozygous prothrombin gene mutation. Indefinite anticoagulation with Xarelto  10 mg daily. Asymptomatic retinal bleed not concerning. No other bleeding. Normal blood counts. Medication well-tolerated. Continued Xarelto  necessary to prevent future thromboembolic events. PLAN: - Continue Xarelto  10 mg daily.  - Labs today show white blood cell 3.9, hemoglobin 13.6, MCV 94.6, platelets 190. Cr and LFTs are WNL.  - Advise reporting of new bleeding or thrombosis symptoms. - Schedule follow-up every six months.  No orders of the defined types were placed in this encounter.   All questions were answered. The patient knows to call the clinic with any problems, questions or concerns.  I have spent a total of 25 minutes minutes of face-to-face and non-face-to-face time, preparing to see the patient, performing a medically appropriate examination, counseling and educating the patient, ordering medications, documenting clinical information in the electronic health record, independently interpreting results and communicating results to the patient, and care coordination.   Norleen IVAR Kidney, MD Department of Hematology/Oncology [facility] Phone: [phone] Pager: [phone] Email: [email or web address]

## 2024-02-05 ENCOUNTER — Emergency Department (HOSPITAL_BASED_OUTPATIENT_CLINIC_OR_DEPARTMENT_OTHER)
Admission: EM | Admit: 2024-02-05 | Discharge: 2024-02-05 | Disposition: A | Discharge status: Home / Self Care | Attending: Emergency Medicine | Admitting: Emergency Medicine

## 2024-02-05 ENCOUNTER — Encounter (HOSPITAL_BASED_OUTPATIENT_CLINIC_OR_DEPARTMENT_OTHER): Payer: Self-pay

## 2024-02-05 ENCOUNTER — Emergency Department (HOSPITAL_BASED_OUTPATIENT_CLINIC_OR_DEPARTMENT_OTHER)

## 2024-02-05 DIAGNOSIS — R040 Epistaxis: Secondary | ICD-10-CM | POA: Insufficient documentation

## 2024-02-05 DIAGNOSIS — M79661 Pain in right lower leg: Secondary | ICD-10-CM | POA: Diagnosis not present

## 2024-02-05 MED ORDER — OXYMETAZOLINE HCL 0.05 % NA SOLN
1.0000 | Freq: Once | NASAL | Status: AC
  Administered 2024-02-05: 1 via NASAL
  Filled 2024-02-05: qty 30

## 2024-02-05 NOTE — ED Notes (Signed)
 US  at bedside.

## 2024-02-05 NOTE — ED Provider Notes (Signed)
 [facility] EMERGENCY DEPARTMENT AT [facility]  Provider Note  CSN: [id number] Arrival date & time: 02/05/24 1015  History Chief Complaint  Patient presents with   Epistaxis    Kyle Reese is a 68 y.o. male with history of DVT/PE on lifelong xarelto  reports he recently had URI, diagnosed as Covid, which had been improving but today had a R sided nosebleed that wouldn't stop at home. No fever today.   He also reports soreness in his R calf, no recent injury. Compliant with Xarelto , most recent DVT was on left side 2 years ago. Requesting US .    Home Medications Prior to Admission medications   Medication Sig Start Date End Date Taking? Authorizing Provider  Coenzyme Q10 (COQ-10 PO) Take 1 capsule by mouth daily.    [provider]  ezetimibe  (ZETIA ) 10 MG tablet Take 5 mg by mouth daily.    [provider]  fish oil-omega-3 fatty acids 1000 MG capsule Take 1 g by mouth daily.    [provider]  rivaroxaban  (XARELTO ) 10 MG TABS tablet Take 1 tablet (10 mg total) by mouth daily. 01/16/24 07/14/24  Dorsey, John T IV, MD  valsartan (DIOVAN) 80 MG tablet Take 80 mg by mouth daily.    [provider]     Allergies    Patient has no known allergies.   Review of Systems   Review of Systems Please see HPI for pertinent positives and negatives  Physical Exam BP 132/85   Pulse (!) 48   Temp 98 F (36.7 C)   Resp 18   SpO2 98%   Physical Exam Vitals and nursing note reviewed.  Constitutional:      Appearance: Normal appearance.  HENT:     Head: Normocephalic and atraumatic.     Nose:     Comments: Small amount of blood in R naris, clots blown out, no active bleeding.     Mouth/Throat:     Mouth: Mucous membranes are moist.  Eyes:     Extraocular Movements: Extraocular movements intact.     Conjunctiva/sclera: Conjunctivae normal.  Cardiovascular:     Rate and Rhythm: Normal rate.  Pulmonary:     Effort: Pulmonary effort  is normal.     Breath sounds: Normal breath sounds.  Abdominal:     General: Abdomen is flat.     Palpations: Abdomen is soft.     Tenderness: There is no abdominal tenderness.  Musculoskeletal:        General: Tenderness (R calf) present. No swelling. Normal range of motion.     Cervical back: Neck supple.  Skin:    General: Skin is warm and dry.  Neurological:     General: No focal deficit present.     Mental Status: He is alert.  Psychiatric:        Mood and Affect: Mood normal.     ED Results / Procedures / Treatments   EKG None  Procedures Procedures  Medications Ordered in the ED Medications  oxymetazoline (AFRIN) 0.05 % nasal spray 1 spray (1 spray Each Nare Given 02/05/24 1051)    Initial Impression and Plan  Patient here for nosebleed, mild R naris. Clots blown out, will give Afrin and monitor for rebleeding. Doppler ordered for R calf pain in setting of recurrent DVT.   ED Course   Clinical Course as of 02/05/24 1429  Sun Feb 05, 2024  1226 No bleeding on reassessment, awaiting US .  [CS]  1329 I  personally viewed the images from radiology studies and agree with radiologist interpretation: US  neg for DVT. Patient reassured. Given instructions for epistaxis management. PCP follow up, RTED for any other concerns.   [CS]    Clinical Course User Index [CS] Roselyn Carlin NOVAK, MD     MDM Rules/Calculators/A&P Medical Decision Making Problems Addressed: Epistaxis: acute illness or injury Right calf pain: acute illness or injury  Amount and/or Complexity of Data Reviewed Radiology: ordered and independent interpretation performed.  Risk OTC drugs.     Final Clinical Impression(s) / ED Diagnoses Final diagnoses:  Epistaxis  Right calf pain    Rx / DC Orders ED Discharge Orders     None        Roselyn Carlin NOVAK, MD 02/05/24 1429

## 2024-02-05 NOTE — ED Notes (Signed)
 Reviewed AVS/discharge instruction with patient. Time allotted for and all questions answered. Patient is agreeable for d/c and escorted to ed exit by staff.

## 2024-02-05 NOTE — ED Triage Notes (Signed)
 He c/o right-sided epistaxis and non-traumatic right calf pain. He also states that he tested positive for COVID three days ago, but I'm over the symptoms now.

## 2024-04-10 NOTE — Progress Notes (Incomplete)
    Chief Complaint: No chief complaint on file.   History of Present Illness:  Kyle Reese is a 68 y.o. male who is seen in consultation from Delice Charleston, MD (Inactive) for evaluation of ***.   Past Medical History:  Past Medical History:  Diagnosis Date   Amblyopia    Closed fracture of left olecranon process 11/23/2014   Colon polyps    Deep vein thrombosis (HCC)    Dysphagia    Hemorrhoids    Hypertension    Nondisplaced fracture of lateral end of left clavicle 11/23/2014    Past Surgical History:  Past Surgical History:  Procedure Laterality Date   disc implant     ESOPHAGOGASTRODUODENOSCOPY  04/22/2011   Procedure: ESOPHAGOGASTRODUODENOSCOPY (EGD);  Surgeon: Gladis MARLA Louder, MD;  Location: [facility];  Service: Endoscopy;  Laterality: N/A;   ORIF ULNAR FRACTURE Left 11/24/2014   Procedure: OPEN REDUCTION INTERNAL FIXATION (ORIF) OLECRANON;  Surgeon: Fonda Olmsted, MD;  Location: [facility];  Service: Orthopedics;  Laterality: Left;   WRIST FRACTURE SURGERY  1990    Allergies:  No Known Allergies  Family History:  No family history on file.  Social History:  Social History   Tobacco Use   Smoking status: Never   Smokeless tobacco: Never  Substance Use Topics   Alcohol use: Yes   Drug use: No    Review of symptoms:  Constitutional:  Negative for unexplained weight loss, night sweats, fever, chills ENT:  Negative for nose bleeds, sinus pain, painful swallowing CV:  Negative for chest pain, shortness of breath, exercise intolerance, palpitations, loss of consciousness Resp:  Negative for cough, wheezing, shortness of breath GI:  Negative for nausea, vomiting, diarrhea, bloody stools GU:  Positives noted in HPI; otherwise negative for gross hematuria, dysuria, urinary incontinence Neuro:  Negative for seizures, poor balance, limb weakness, slurred speech Psych:  Negative for lack of energy, depression, anxiety Endocrine:  Negative for polydipsia,  polyuria, symptoms of hypoglycemia (dizziness, hunger, sweating) Hematologic:  Negative for anemia, purpura, petechia, prolonged or excessive bleeding, use of anticoagulants  Allergic:  Negative for difficulty breathing or choking as a result of exposure to anything; no shellfish allergy; no allergic response (rash/itch) to materials, foods  Physical exam: There were no vitals taken for this visit. GENERAL APPEARANCE:  Well appearing, well developed, well nourished, NAD HEENT: Atraumatic, Normocephalic. NECK: Normal appearance LUNGS: Normal inspiratory and expiratory excursion HEART: Regular Rate ABDOMEN: ***. GU: Phallus normal, no lesions. Scrotal skin normal. Testicles/epididymal structures normal. Meatus normal. Normal anal sphincter tone, prostate ***mL, symmetric, non nodular, non tender. EXTREMITIES: Moves all extremities well.  Without clubbing, cyanosis, or edema. NEUROLOGIC:  Alert and oriented x 3, normal gait, CN II-XII grossly intact.  MENTAL STATUS:  Appropriate. SKIN:  Warm, dry and intact.    Results: No results found for this or any previous visit (from the past 24 hours).  I have reviewed referring/prior physicians notes  I have reviewed urinalysis  I have reviewed PSA results  I have reviewed prior imaging  I have reviewed urine culture results  Assessment: ***   Plan: ***

## 2024-04-11 ENCOUNTER — Encounter: Payer: Self-pay | Admitting: Urology

## 2024-04-11 ENCOUNTER — Ambulatory Visit: Admitting: Urology

## 2024-04-11 DIAGNOSIS — N281 Cyst of kidney, acquired: Secondary | ICD-10-CM | POA: Diagnosis not present

## 2024-04-11 DIAGNOSIS — N400 Enlarged prostate without lower urinary tract symptoms: Secondary | ICD-10-CM

## 2024-04-11 DIAGNOSIS — R351 Nocturia: Secondary | ICD-10-CM

## 2024-04-11 DIAGNOSIS — N3943 Post-void dribbling: Secondary | ICD-10-CM | POA: Diagnosis not present

## 2024-04-11 DIAGNOSIS — N401 Enlarged prostate with lower urinary tract symptoms: Secondary | ICD-10-CM | POA: Diagnosis not present

## 2024-04-11 LAB — URINALYSIS, ROUTINE W REFLEX MICROSCOPIC
Glucose, UA: NEGATIVE
Leukocytes,UA: NEGATIVE
Nitrite, UA: NEGATIVE
RBC, UA: NEGATIVE
Specific Gravity, UA: 1.02 (ref 1.005–1.030)
Urobilinogen, Ur: 0.2 mg/dL (ref 0.2–1.0)
pH, UA: 5.5 (ref 5.0–7.5)

## 2024-04-11 LAB — MICROSCOPIC EXAMINATION

## 2024-04-12 ENCOUNTER — Other Ambulatory Visit: Payer: Self-pay

## 2024-04-12 ENCOUNTER — Telehealth: Payer: Self-pay

## 2024-04-12 DIAGNOSIS — N400 Enlarged prostate without lower urinary tract symptoms: Secondary | ICD-10-CM

## 2024-04-12 NOTE — Telephone Encounter (Signed)
-----   Message from Garnette Shack, MD sent at 04/11/2024  5:11 PM EST ----- He had some abnormalities on his U/A. Can you have him drop by for recheck--u/a only in a couple of weeks?

## 2024-04-12 NOTE — Telephone Encounter (Signed)
 Scheduled pt for repeat UA in 2 weeks. Orders placed, patient confirmed.

## 2024-04-23 ENCOUNTER — Encounter: Payer: Self-pay | Admitting: Family Medicine

## 2024-04-23 ENCOUNTER — Ambulatory Visit: Admitting: Family Medicine

## 2024-04-23 ENCOUNTER — Other Ambulatory Visit: Payer: Self-pay

## 2024-04-23 VITALS — BP 124/78 | Ht 67.0 in | Wt 175.0 lb

## 2024-04-23 DIAGNOSIS — M7121 Synovial cyst of popliteal space [Baker], right knee: Secondary | ICD-10-CM

## 2024-04-23 DIAGNOSIS — M25561 Pain in right knee: Secondary | ICD-10-CM

## 2024-04-23 DIAGNOSIS — Z7901 Long term (current) use of anticoagulants: Secondary | ICD-10-CM

## 2024-04-23 NOTE — Progress Notes (Signed)
 DATE OF VISIT: 04/23/2024        Kyle Reese DOB: 1955-12-21 MRN: [id number]  Discussed the use of AI scribe software for clinical note transcription with the patient, who gave verbal consent to proceed.  History of Present Illness Kyle Reese is a 68 year old male with right posterior-medial knee pain  meniscal degeneration and Baker's cyst who presents with right posterior medial knee pain.  Right knee pain - Pain localized to posterior medial and posterior aspects of the right knee - Intermittent exacerbations and remissions - Most pronounced with descending stairs, occasionally with ascending stairs, and when rising from a seated position without support - Provoked by deep flexion activities such as squatting or lunges or with quad stretches - No swelling  - No anterior knee or calf pain except with deep palpation in the area of discomfort - Symptoms began after performing box jumps at the gym several weeks ago  Impact on physical activity - Avid cyclist, riding approximately 5,000 miles per year - No pain during cycling - Pain does not interfere with cycling, which he continues regularly  Imaging and diagnostic evaluation - Whole body MRI approximately six weeks ago identified a Baker's cyst in the right knee - Area of discomfort is inferior to the location described in the MRI report - He is on chronic anticoagulation for history of DVT and PE, went to the ER due to nosebleed 02/05/2024.  Venous ultrasound at that time was negative for DVT and there were no signs of a Baker's cyst.    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   Medications:  Outpatient Encounter Medications as of 04/23/2024  Medication Sig   Coenzyme Q10 (COQ-10 PO) Take 1 capsule by mouth daily.   ezetimibe  (ZETIA ) 10 MG tablet Take 5 mg by mouth daily.   fish oil-omega-3 fatty acids 1000 MG capsule Take 1 g by mouth daily.   rivaroxaban  (XARELTO ) 10 MG TABS tablet Take 1 tablet (10 mg total) by mouth daily.   valsartan (DIOVAN) 80 MG tablet Take 80 mg by mouth daily.   No facility-administered encounter medications on file as of 04/23/2024.    Allergies: has no known allergies.  Physical Examination: Vitals: BP 124/78   Ht 5' 7 (1.702 m)   Wt 175 lb (79.4 kg)   BMI 27.41 kg/m  GENERAL:  Kyle Reese is a 68 y.o. male appearing their stated age, alert and oriented x 3, in no apparent distress.  SKIN: no rashes or lesions, skin clean, dry, intact MSK: Knee: Right knee without any gross deformity.  No swelling or effusion.  Full range of motion with some pain at terminal flexion.  No medial or lateral joint line tenderness.  Mild tenderness palpation with deep palpation over the posterior medial knee over the proximal medial gastroc and the distal hamstrings.  No palpable masses.  Negative McMurray.  Negative Lachman, negative varus and valgus stress test. Left knee with full range of motion pain, weakness, instability Walking without a limp NEURO: sensation intact to light touch VASC: pulses  2+ and symmetric lower extremity bilaterally, no edema  Radiology: Limited MSK ultrasound right knee Date: 04/23/2024 Indication: Posterior knee pain Findings: - No effusion in the suprapatellar pouch.  Normal-appearing quad tendon in short and long axis views - Normal-appearing patellar tendon in short and long axis views - Lateral joint line with lateral meniscus degeneration and visible  calcifications - Medial joint line with degenerative tearing of the medial meniscus, no calcifications noted - Popliteal fossa with small Baker's cyst measuring approximately 7.4 mm x 8.6 mm  Impression: - Small Baker's cyst - Degenerative lateral meniscus tearing with calcifications - Degenerative medial meniscus tearing Images and interpretation completed by Rainell Cedar, DO    VENOUS DOPPLER 02/05/24 showing: IMPRESSION: Negative for deep venous thrombosis in the right leg.  MRI BODY 1 to 2 months ago showing: - medium Baker Cyst in the right knee  Assessment & Plan Right knee pain with MSK ultrasound showing meniscal degeneration of both lateral and medial meniscus along with with small Baker's cyst Rght posterior medial knee pain due to meniscal degeneration with microtearing, calcifications, and a small Baker's cyst. Symptoms aggravated by deep flexion and loading. Imaging confirmed meniscal degeneration and a small Baker's cyst without significant joint effusion or advanced osteoarthritis. Conservative management indicated. - Performed diagnostic musculoskeletal ultrasound of the right knee, confirming meniscal degeneration and a small Baker's cyst. - Recommended neoprene knee sleeve for compression and symptom relief; offered in-clinic fitting with Body[employer]ix knee sleeve - Advised topical diclofenac (Voltaren) gel to the posterior knee up to 3-4 times daily as needed, given contraindication to oral NSAIDs due to anticoagulation. - Advised avoidance of high-impact activities and deep flexion exercises to prevent symptom exacerbation and cyst irritation. - Encouraged continuation of cycling and other low-impact activities for knee strength and symptom management. - Provided education on the natural history of Baker's cysts, including size fluctuation and limited benefit of aspiration or injection for small cysts. - Discussed that surgical intervention is not indicated given the cyst's  small size and lack of severe symptoms or joint pathology. - Instructed to monitor for new or worsening swelling and to return if symptoms change or worsen. - f/u prn   Patient expressed understanding & agreement with above.  Encounter Diagnoses  Name Primary?   Right knee pain, unspecified chronicity Yes   Baker's cyst of knee, right    Chronic anticoagulation     Orders Placed This Encounter  Procedures   US  LIMITED JOINT SPACE STRUCTURES LOW RIGHT     Contains text generated by [employer].

## 2024-04-30 ENCOUNTER — Other Ambulatory Visit

## 2024-04-30 DIAGNOSIS — N400 Enlarged prostate without lower urinary tract symptoms: Secondary | ICD-10-CM

## 2024-04-30 LAB — URINALYSIS, ROUTINE W REFLEX MICROSCOPIC
Bilirubin, UA: NEGATIVE
Glucose, UA: NEGATIVE
Ketones, UA: NEGATIVE
Leukocytes,UA: NEGATIVE
Nitrite, UA: NEGATIVE
Protein,UA: NEGATIVE
RBC, UA: NEGATIVE
Specific Gravity, UA: 1.015 (ref 1.005–1.030)
Urobilinogen, Ur: 0.2 mg/dL (ref 0.2–1.0)
pH, UA: 6 (ref 5.0–7.5)

## 2024-05-01 ENCOUNTER — Ambulatory Visit: Payer: Self-pay | Admitting: Urology

## 2024-05-08 ENCOUNTER — Encounter: Payer: Self-pay | Admitting: Internal Medicine

## 2024-07-13 ENCOUNTER — Inpatient Hospital Stay: Admitting: Hematology and Oncology

## 2024-07-13 ENCOUNTER — Inpatient Hospital Stay
